# Patient Record
Sex: Female | Born: 2000 | Race: Black or African American | Hispanic: Yes | Marital: Single | Smoking: Current some day smoker
Health system: Southern US, Community
[De-identification: ages and names within clinical notes are randomized; demographics above are authoritative.]

## PROBLEM LIST (undated history)

## (undated) DIAGNOSIS — F419 Anxiety disorder, unspecified: Secondary | ICD-10-CM

---
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
## 2020-12-25 ENCOUNTER — Emergency Department: Payer: Medicaid Other

## 2020-12-25 ENCOUNTER — Emergency Department
Admission: EM | Admit: 2020-12-25 | Discharge: 2020-12-25 | Disposition: A | Discharge status: Home / Self Care | Payer: Medicaid Other | Attending: Emergency Medicine | Admitting: Emergency Medicine

## 2020-12-25 ENCOUNTER — Other Ambulatory Visit: Payer: Self-pay

## 2020-12-25 ENCOUNTER — Encounter: Payer: Self-pay | Admitting: Emergency Medicine

## 2020-12-25 DIAGNOSIS — U071 COVID-19: Secondary | ICD-10-CM | POA: Insufficient documentation

## 2020-12-25 DIAGNOSIS — B349 Viral infection, unspecified: Secondary | ICD-10-CM | POA: Insufficient documentation

## 2020-12-25 DIAGNOSIS — Z79899 Other long term (current) drug therapy: Secondary | ICD-10-CM | POA: Insufficient documentation

## 2020-12-25 DIAGNOSIS — R0602 Shortness of breath: Secondary | ICD-10-CM | POA: Diagnosis present

## 2020-12-25 DIAGNOSIS — Z20822 Contact with and (suspected) exposure to covid-19: Secondary | ICD-10-CM

## 2020-12-25 LAB — SARS CORONAVIRUS 2 (TAT 6-24 HRS): SARS Coronavirus 2: POSITIVE — AB

## 2020-12-25 MED ORDER — ALBUTEROL SULFATE HFA 108 (90 BASE) MCG/ACT IN AERS: 2.0000 | INHALATION_SPRAY | Freq: Four times a day (QID) | RESPIRATORY_TRACT | 0 refills | Status: DC | PRN

## 2020-12-25 NOTE — ED Triage Notes (Addendum)
Patient ambulatory to triage with steady gait, without difficulty or distress noted; pt reports body aches & congestion tonight

## 2020-12-25 NOTE — ED Provider Notes (Signed)
[facility] Emergency Department Provider Note   ____________________________________________   Event Date/Time   First MD Initiated Contact with Patient 12/25/20 [phone]     (approximate)  I have reviewed the triage vital signs and the nursing notes.   HISTORY  Chief Complaint Nasal Congestion    HPI Megan Saunders [place] is a 20 y.o. female with past medical history of anxiety who presents to the ED complaining of shortness of breath.  Patient reports that over the past 2 days she has developed cough, congestion, body aches, and difficulty breathing.  She is concerned she could have COVID-19 as someone at her work recently tested positive.  She has not had any fevers or chest pain, denies vomiting or diarrhea.  She has minimal difficulty breathing at rest but states her breathing gets worse when she is moving or coughing.  She has not taken anything for her symptoms at home.  She reports receiving 1 dose of the COVID-19 vaccine.        Past Medical History:  Diagnosis Date  . Anxiety     Patient Active Problem List   Diagnosis Date Noted  . MDD (major depressive disorder), recurrent episode, severe (HCC) 04/24/2018    History reviewed. No pertinent surgical history.  Prior to Admission medications   Medication Sig Start Date End Date Taking? Authorizing Provider  albuterol (VENTOLIN HFA) 108 (90 Base) MCG/ACT inhaler Inhale 2 puffs into the lungs every 6 (six) hours as needed for wheezing or shortness of breath. 12/25/20  Yes Chesley Noon, MD  escitalopram (LEXAPRO) 20 MG tablet Take 1 tablet (20 mg total) by mouth daily. 05/01/18   Denzil Magnuson, NP    Allergies Dust mite extract, Grass extracts [gramineae pollens], Mold extract [trichophyton], Other, and Pollen extract  No family history on file.  Social History Social History   Tobacco Use  . Smoking status: Never Smoker  . Smokeless tobacco: Never Used  Substance Use Topics  . Alcohol  use: Never  . Drug use: Never    Review of Systems  Constitutional: No fever/chills Eyes: No visual changes. ENT: No sore throat. Cardiovascular: Denies chest pain. Respiratory: Positive for cough and shortness of breath. Gastrointestinal: No abdominal pain.  No nausea, no vomiting.  No diarrhea.  No constipation. Genitourinary: Negative for dysuria. Musculoskeletal: Negative for back pain.  Positive for myalgias. Skin: Negative for rash. Neurological: Negative for headaches, focal weakness or numbness.  ____________________________________________   PHYSICAL EXAM:  VITAL SIGNS: ED Triage Vitals  Enc Vitals Group     BP 12/25/20 0321 121/73     Pulse Rate 12/25/20 0321 (!) 136     Resp 12/25/20 0321 18     Temp 12/25/20 0321 99.3 F (37.4 C)     Temp Source 12/25/20 0321 Oral     SpO2 12/25/20 0321 97 %     Weight 12/25/20 0316 270 lb (122.5 kg)     Height 12/25/20 0316 5\' 9"  (1.753 Saunders)     Head Circumference --      Peak Flow --      Pain Score 12/25/20 0316 0     Pain Loc --      Pain Edu? --      Excl. in GC? --     Constitutional: Alert and oriented. Eyes: Conjunctivae are normal. Head: Atraumatic. Nose: No congestion/rhinnorhea. Mouth/Throat: Mucous membranes are moist. Neck: Normal ROM Cardiovascular: Normal rate, regular rhythm. Grossly normal heart sounds.  2+ radial pulses bilaterally. Respiratory:  Normal respiratory effort.  No retractions. Lungs CTAB. Gastrointestinal: Soft and nontender. No distention. Genitourinary: deferred Musculoskeletal: No lower extremity tenderness nor edema. Neurologic:  Normal speech and language. No gross focal neurologic deficits are appreciated. Skin:  Skin is warm, dry and intact. No rash noted. Psychiatric: Mood and affect are normal. Speech and behavior are normal.  ____________________________________________   LABS (all labs ordered are listed, but only abnormal results are displayed)  Labs Reviewed  SARS  CORONAVIRUS 2 (TAT 6-24 HRS)   ____________________________________________  EKG  ED ECG REPORT I, Chesley Noon, the attending physician, personally viewed and interpreted this ECG.   Date: 12/25/2020  EKG Time: 4:36  Rate: 125  Rhythm: sinus tachycardia  Axis: Normal  Intervals:none  ST&T Change: None   PROCEDURES  Procedure(s) performed (including Critical Care):  Procedures   ____________________________________________   INITIAL IMPRESSION / ASSESSMENT AND PLAN / ED COURSE       20 year old female with past medical history of anxiety who presents to the ED complaining of cough, shortness of breath, congestion, and myalgias for the past 48 hours.  Patient initially noted to be tachycardic, but this is gradually improving without intervention.  EKG shows sinus tachycardia without ischemic changes.  We will check chest x-ray and testing for COVID-19, overall symptoms sound most consistent with viral syndrome and likely COVID-19.  Low suspicion for PE as patient denies any chest pain and has minimal difficulty breathing, is maintaining O2 sats on room air.  Chest x-ray reviewed by me and shows no infiltrate, edema, or effusion.  Patient continues to maintain O2 sats on room air and is appropriate for discharge home with PCP follow-up.  She was counseled to return to the ED for new worsening symptoms, patient agrees with plan.      ____________________________________________   FINAL CLINICAL IMPRESSION(S) / ED DIAGNOSES  Final diagnoses:  Viral syndrome  Suspected COVID-19 virus infection     ED Discharge Orders         Ordered    albuterol (VENTOLIN HFA) 108 (90 Base) MCG/ACT inhaler  Every 6 hours PRN       Note to Pharmacy: Please supply with spacer   12/25/20 0542           Note:  This document was prepared using Dragon voice recognition software and may include unintentional dictation errors.   Chesley Noon, MD 12/25/20 [phone]

## 2021-01-08 ENCOUNTER — Encounter: Payer: Self-pay | Admitting: Emergency Medicine

## 2021-11-26 IMAGING — CR DG CHEST 2V
2 series · 2 of 2 positions shown · non-contrast
Comparison: 09/03/2010

CLINICAL DATA: Dyspnea, chest congestion

EXAM:
CHEST - 2 VIEW

[chest pa]
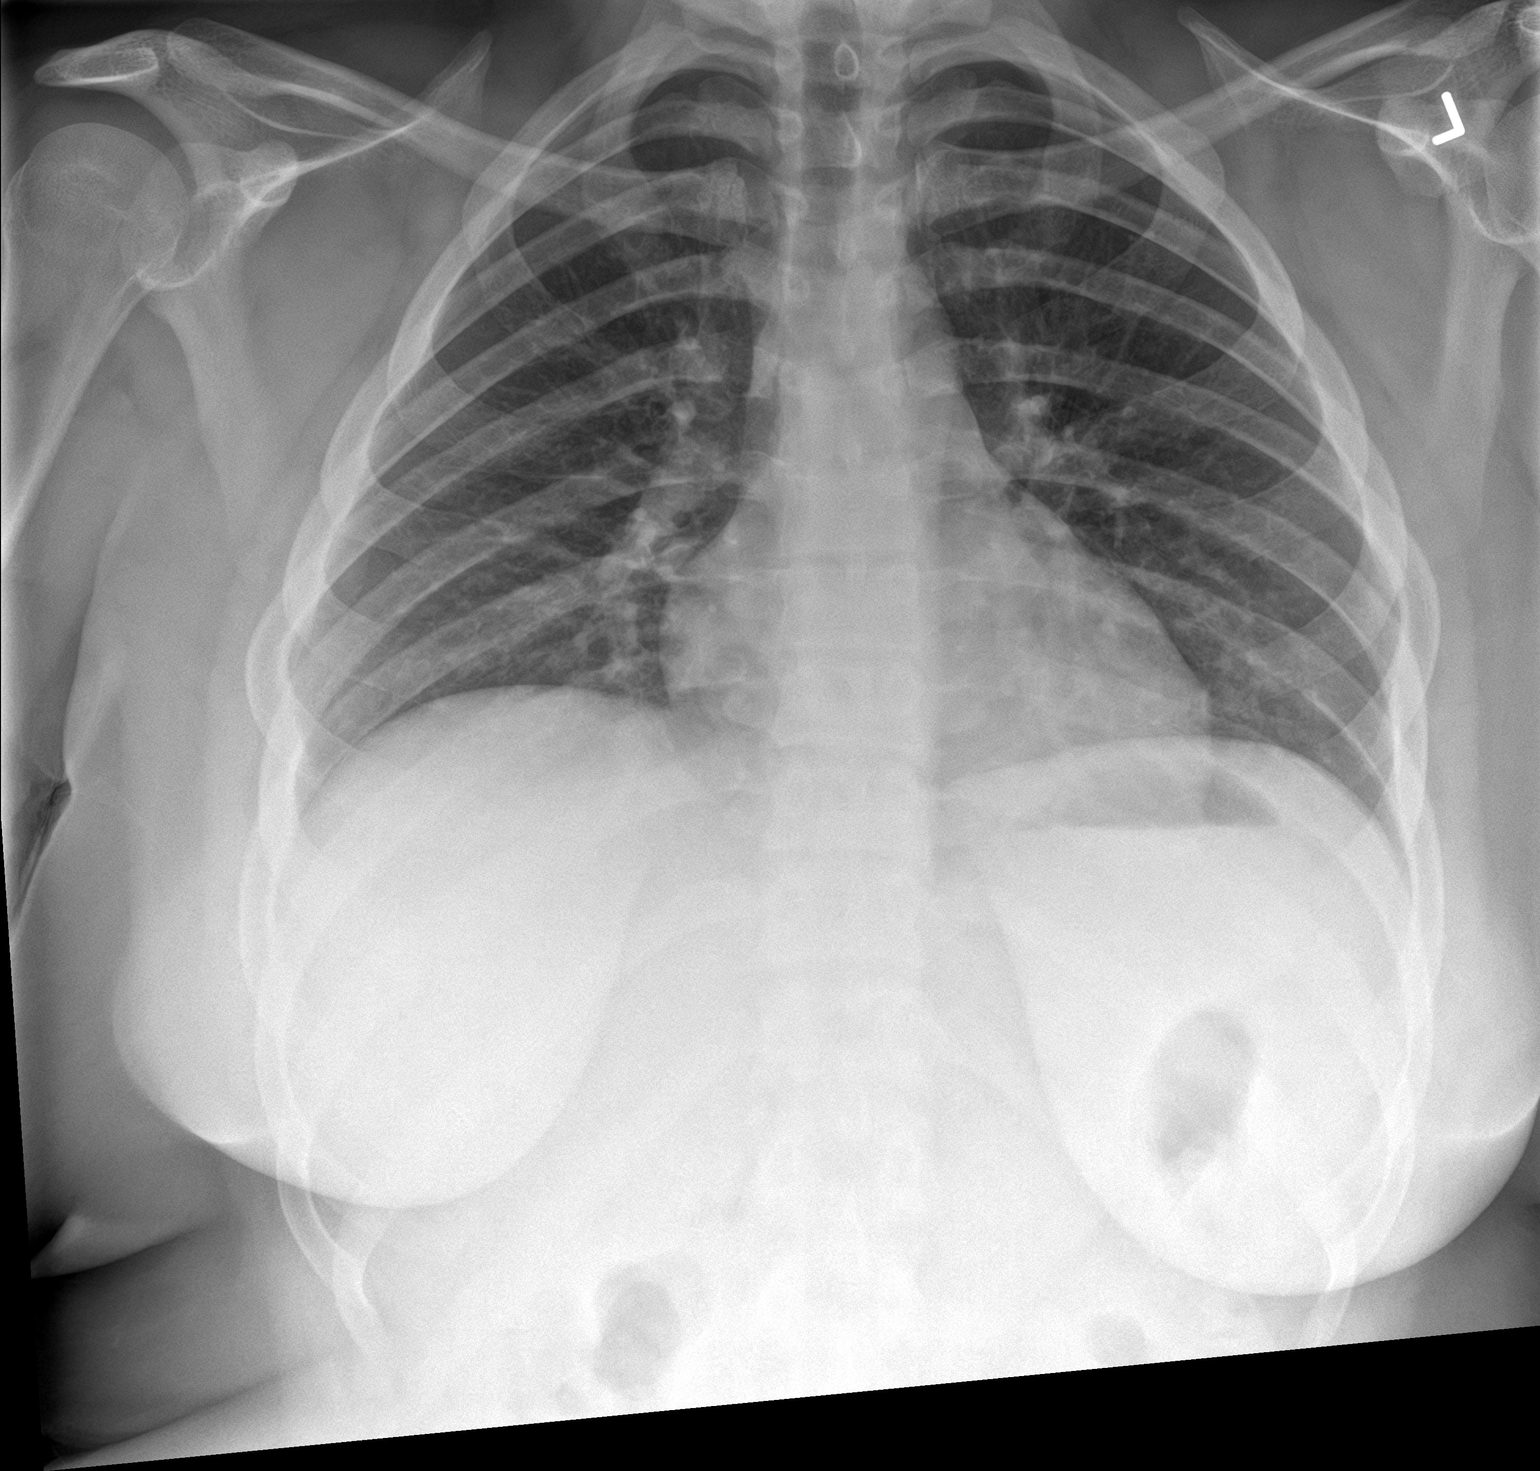

[chest lat]
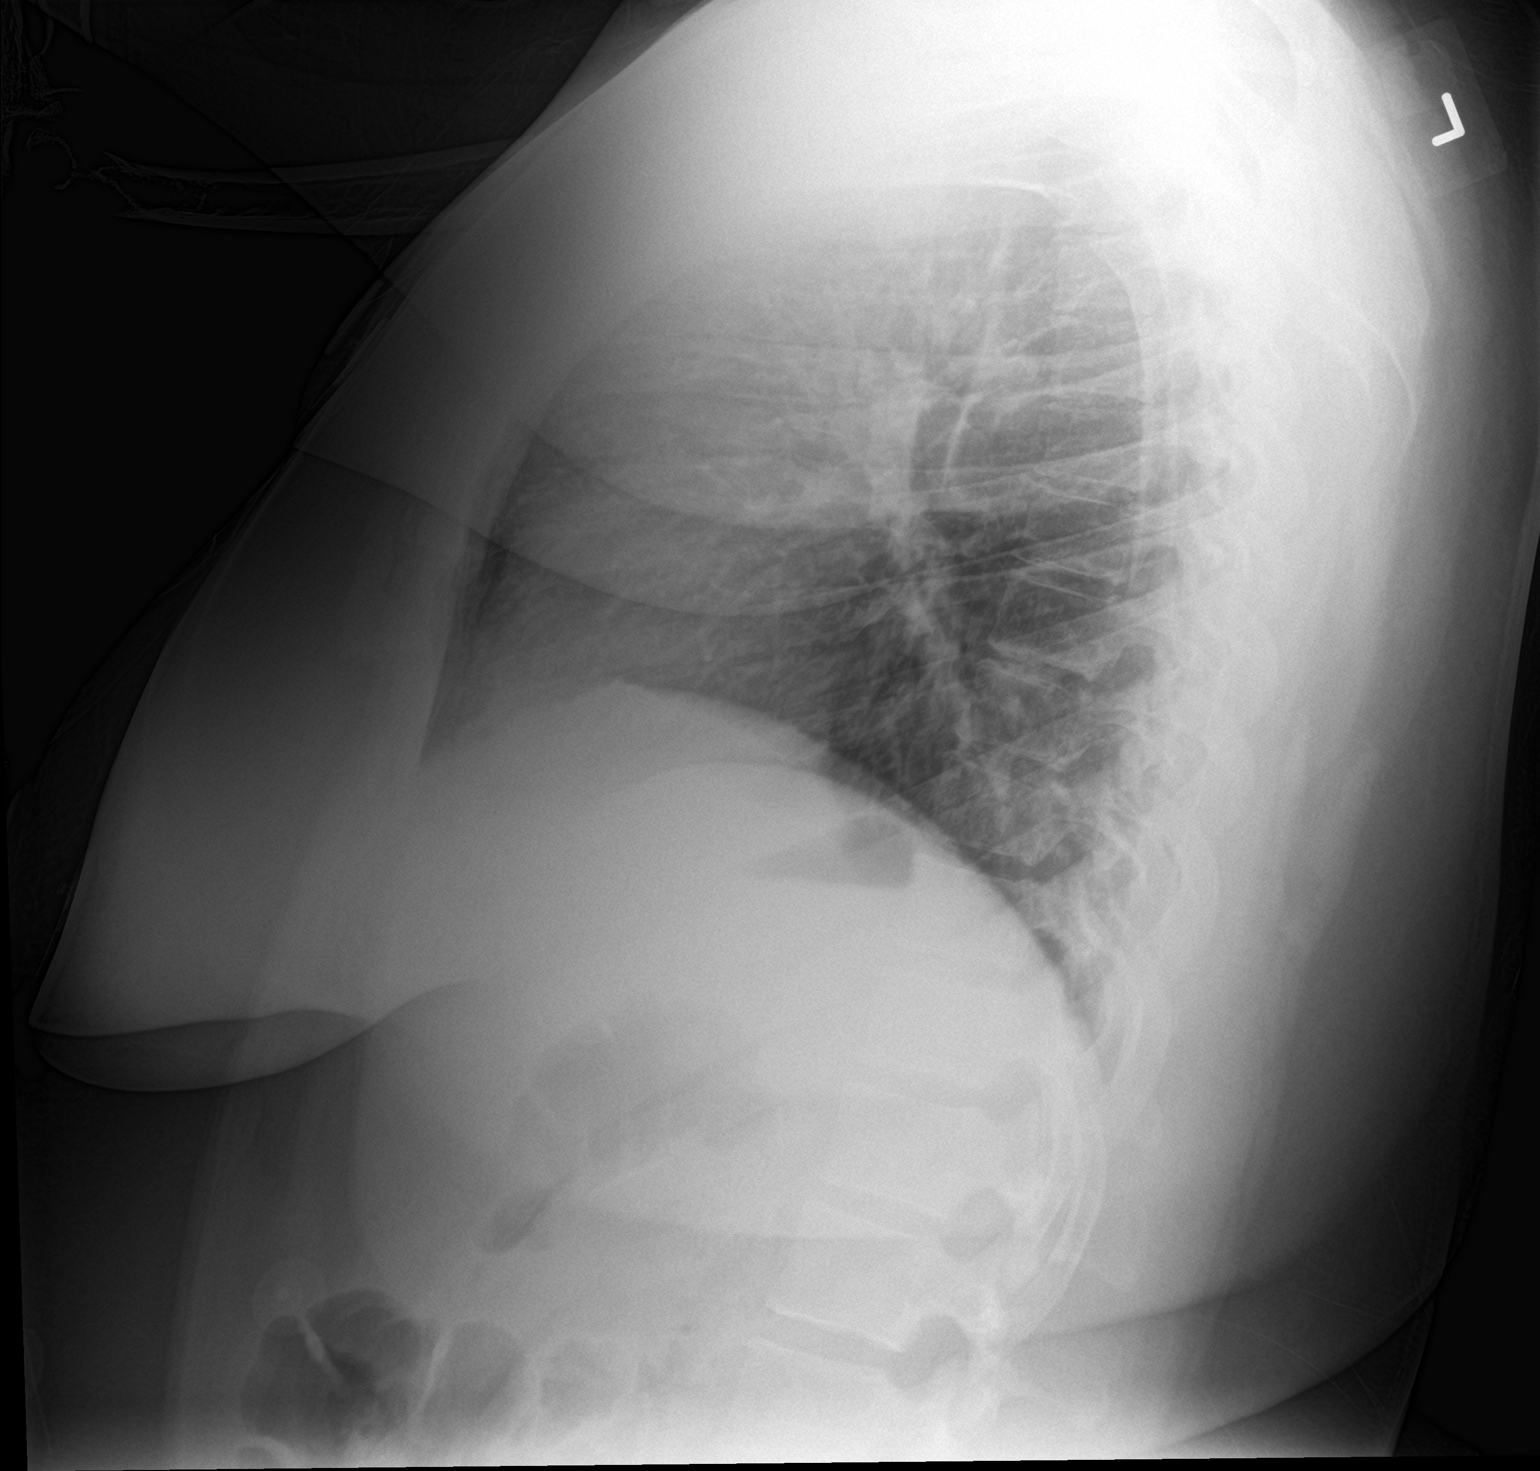

[2 of 2 positions shown; findings below may reference images not displayed]

FINDINGS: The heart size and mediastinal contours are within normal limits.
Both lungs are clear. The visualized skeletal structures are
unremarkable.
IMPRESSION: No active cardiopulmonary disease.

## 2023-01-09 ENCOUNTER — Ambulatory Visit: Payer: Medicaid Other | Admitting: Family Medicine

## 2023-01-09 ENCOUNTER — Encounter: Payer: Self-pay | Admitting: Family Medicine

## 2023-01-09 DIAGNOSIS — Z113 Encounter for screening for infections with a predominantly sexual mode of transmission: Secondary | ICD-10-CM

## 2023-01-09 LAB — WET PREP FOR TRICH, YEAST, CLUE
Trichomonas Exam: NEGATIVE
Yeast Exam: NEGATIVE

## 2023-01-09 NOTE — Progress Notes (Signed)
Pt is here for STD screening.  Wet mount results reviewed, no treatment required.  Condoms declined.  Windle Guard, RN

## 2023-01-09 NOTE — Progress Notes (Signed)
[facility] Department  STI clinic/screening visit [place] [address] [phone]  Subjective:  Megan Saunders is a 22 y.o. female being seen today for an STI screening visit. The patient reports they do not have symptoms.  Patient reports that they do not desire a pregnancy in the next year.   They reported they are not interested in discussing contraception today.    Patient's last menstrual period was 12/26/2022 (exact date).  Patient has the following medical conditions:   Patient Active Problem List   Diagnosis Date Noted   MDD (major depressive disorder), recurrent episode, severe ([place]) 04/24/2018    Chief Complaint  Patient presents with   SEXUALLY TRANSMITTED DISEASE    Screening for STI    HPI  Patient reports to clinic for STI testing. Asymptomatic  Does the patient using douching products? No  Last HIV test per patient/review of record was No results found for: "HMHIVSCREEN" No results found for: "HIV" Patient reports last pap was never- pt just turned 21 in Sept 23  Screening for MPX risk: Does the patient have an unexplained rash? No Is the patient MSM? No Does the patient endorse multiple sex partners or anonymous sex partners? No Did the patient have close or sexual contact with a person diagnosed with MPX? No Has the patient traveled outside the [name] where MPX is endemic? No Is there a high clinical suspicion for MPX-- evidenced by one of the following No  -Unlikely to be chickenpox  -Lymphadenopathy  -Rash that present in same phase of evolution on any given body part See flowsheet for further details and programmatic requirements.   Immunization history:  Immunization History  Administered Date(s) Administered   Tdap 05/03/2019     The following portions of the patient's history were reviewed and updated as appropriate: allergies, current medications, past medical history, past social history, past surgical  history and problem list.  Objective:  There were no vitals filed for this visit.  Physical Exam Vitals and nursing note reviewed.  Constitutional:      Appearance: She is obese.  HENT:     Head: Normocephalic and atraumatic.     Mouth/Throat:     Mouth: Mucous membranes are moist.     Pharynx: Oropharynx is clear. No oropharyngeal exudate or posterior oropharyngeal erythema.  Pulmonary:     Effort: Pulmonary effort is normal.  Abdominal:     Palpations: Abdomen is soft. There is no mass.     Tenderness: There is no abdominal tenderness. There is no rebound.  Genitourinary:    General: Normal vulva.     Exam position: Lithotomy position.     Pubic Area: No rash or pubic lice.      Labia:        Right: No rash or lesion.        Left: No rash or lesion.      Vagina: Normal. No vaginal discharge, erythema, bleeding or lesions.     Cervix: No cervical motion tenderness, discharge, friability, lesion or erythema.     Uterus: Normal.      Adnexa: Right adnexa normal and left adnexa normal.     Rectum: Normal.     Comments: pH = 4 Lymphadenopathy:     Head:     Right side of head: No preauricular or posterior auricular adenopathy.     Left side of head: No preauricular or posterior auricular adenopathy.     Cervical: No cervical adenopathy.  Upper Body:     Right upper body: No supraclavicular, axillary or epitrochlear adenopathy.     Left upper body: No supraclavicular, axillary or epitrochlear adenopathy.     Lower Body: No right inguinal adenopathy. No left inguinal adenopathy.  Skin:    General: Skin is warm and dry.     Findings: No rash.  Neurological:     Mental Status: She is alert and oriented to person, place, and time.      Assessment and Plan:  Megan Saunders is a 22 y.o. female presenting to the [facility] Department for STI screening  1. Screening for venereal disease  - HIV [facility] - Chlamydia/Gonorrhea [place] - Syphilis  Serology,  Lab - Gonococcus culture   Patient accepted all screenings including oral, vaginal CT/GC and bloodwork for HIV/RPR, and wet prep. Patient meets criteria for HepB screening? No. Ordered? not applicable Patient meets criteria for HepC screening? No. Ordered? not applicable  Treat wet prep per standing order Discussed time line for [facility] results and that patient will be called with positive results and encouraged patient to call if she had not heard in 2 weeks.  Counseled to return or seek care for continued or worsening symptoms Recommended condom use with all sex  Patient is currently using Hormonal Contraception: Injection, Rings and Patches to prevent pregnancy.    Return if symptoms worsen or fail to improve.  Total time spent 20 minutes.  Sharlet Salina, [place]

## 2023-01-09 NOTE — Addendum Note (Signed)
Addended by: Forest Becker on: 01/09/2023 02:52 PM   Modules accepted: Orders

## 2023-01-09 NOTE — Progress Notes (Signed)
Pt is here for STD screening.  Wet mount results reviewed, no treatment required.   Condoms declined.  .mecreed

## 2023-01-10 LAB — HM HIV SCREENING LAB: HM HIV Screening: NEGATIVE

## 2023-01-14 LAB — GONOCOCCUS CULTURE

## 2023-06-18 ENCOUNTER — Emergency Department
Admission: EM | Admit: 2023-06-18 | Discharge: 2023-06-18 | Disposition: A | Discharge status: Home / Self Care | Payer: Medicaid Other | Attending: Emergency Medicine | Admitting: Emergency Medicine

## 2023-06-18 ENCOUNTER — Other Ambulatory Visit: Payer: Self-pay

## 2023-06-18 DIAGNOSIS — S60221A Contusion of right hand, initial encounter: Secondary | ICD-10-CM | POA: Insufficient documentation

## 2023-06-18 DIAGNOSIS — S0993XA Unspecified injury of face, initial encounter: Secondary | ICD-10-CM | POA: Diagnosis present

## 2023-06-18 DIAGNOSIS — S0083XA Contusion of other part of head, initial encounter: Secondary | ICD-10-CM
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
## 2023-11-27 ENCOUNTER — Ambulatory Visit: Payer: Medicaid Other

## 2023-12-15 ENCOUNTER — Ambulatory Visit: Payer: Medicaid Other | Admitting: Nurse Practitioner

## 2023-12-15 ENCOUNTER — Encounter: Payer: Self-pay | Admitting: Family Medicine

## 2023-12-15 VITALS — BP 107/75 | HR 68 | Wt 270.6 lb

## 2023-12-15 DIAGNOSIS — Z113 Encounter for screening for infections with a predominantly sexual mode of transmission: Secondary | ICD-10-CM

## 2023-12-15 DIAGNOSIS — Z7251 High risk heterosexual behavior: Secondary | ICD-10-CM

## 2023-12-15 LAB — HM HIV SCREENING LAB: HM HIV Screening: NEGATIVE

## 2023-12-15 LAB — WET PREP FOR TRICH, YEAST, CLUE
Trichomonas Exam: NEGATIVE
Yeast Exam: NEGATIVE

## 2023-12-15 LAB — HM HEPATITIS C SCREENING LAB: HM Hepatitis Screen: NEGATIVE

## 2023-12-15 LAB — HEPATITIS B SURFACE ANTIGEN: Hepatitis B Surface Ag: NONREACTIVE

## 2023-12-15 MED ORDER — ELLA 30 MG PO TABS
1.0000 | ORAL_TABLET | Freq: Once | ORAL | 0 refills | Status: AC
Start: 2023-12-15 — End: 2023-12-15

## 2023-12-15 NOTE — Progress Notes (Cosign Needed)
[facility] [address] Main Number: [phone]   Family Planning Visit- Initial Visit  Subjective:  Megan Saunders is a 22 y.o.  No obstetric history on file.   being seen today for an initial annual visit and to discuss reproductive life planning.  The patient is currently using Oral Contraceptive for pregnancy prevention. Patient reports   does not want a pregnancy in the next year.    They report they are looking for a method that provides High efficacy at preventing pregnancy  Patient has the following medical conditions has MDD (major depressive disorder), recurrent episode, severe (HCC) on their problem list.  CC: Patient requests ECP for unprotected sex less than 12 hours ago.   Patient reports to clinic today for asymptomatic STI TOC testing for chlamydia. Please see separate encounter note for information specific to that visit.  The patient also mentioned having unprotected sex this morning and missing several hormonal oral contraceptive pills since having her last period. Patient requests ECP today.    There is no height or weight on file to calculate BMI. - Patient is eligible for diabetes screening based on BMI> 61 and age >35?  no HA1C ordered? not applicable  Patient reports 2  partner/s in last year. Desires STI screening?  Yes  Has patient been screened once for HCV in the past?  No  No results found for: "HCVAB"  Does the patient have current drug use (including MJ), have a partner with drug use, and/or has been incarcerated since last result? Yes  If yes-- Screen for HCV through [facility]   Does the patient meet criteria for HBV testing? Yes  Criteria:  -Household, sexual or needle sharing contact with HBV -History of drug use -HIV positive -Those with known Hep C   Health Maintenance Due  Topic Date Due   HPV VACCINES (1 - 3-dose series) Never done   CHLAMYDIA SCREENING  04/26/2019    Hepatitis C Screening  Never done   Cervical Cancer Screening (Pap smear)  Never done   INFLUENZA VACCINE  07/20/2023   COVID-19 Vaccine (1 - 2024-25 season) Never done    Review of Systems  Unable to perform ROS: Other (Patient arrived for STI screening appointment. Family Planning appt created solely for prescription of ECP. No phytsical performed.)    The following portions of the patient's history were reviewed and updated as appropriate: allergies, current medications, past family history, past medical history, past social history, past surgical history and problem list. Problem list updated.   See flowsheet for other program required questions.  Objective:  There were no vitals filed for this visit.  Physical Exam Nursing note reviewed.  Constitutional:      Appearance: Normal appearance.  HENT:     Head: Normocephalic.     Salivary Glands: Right salivary gland is not diffusely enlarged or tender. Left salivary gland is not diffusely enlarged or tender.     Mouth/Throat:     Lips: Pink. No lesions.     Mouth: Mucous membranes are moist.     Tongue: No lesions. Tongue does not deviate from midline.     Pharynx: Oropharynx is clear. Uvula midline. No oropharyngeal exudate or posterior oropharyngeal erythema.     Tonsils: No tonsillar exudate.  Eyes:     General:        Right eye: No discharge.        Left eye: No discharge.  Pulmonary:  Effort: Pulmonary effort is normal.  Genitourinary:    Comments: Patient asymptomatic. Declined genital exam. Self swabbed.  Lymphadenopathy:     Head:     Right side of head: No submental, submandibular, tonsillar, preauricular or posterior auricular adenopathy.     Left side of head: No submental, submandibular, tonsillar, preauricular or posterior auricular adenopathy.     Cervical: No cervical adenopathy.     Right cervical: No superficial or posterior cervical adenopathy.    Left cervical: No superficial or posterior cervical  adenopathy.     Upper Body:     Right upper body: No supraclavicular or axillary adenopathy.     Left upper body: No supraclavicular or axillary adenopathy.  Skin:    General: Skin is warm and dry.     Comments: Exposed areas only and back. Skin tone appropriate for ethnicity.   Neurological:     Mental Status: Megan Saunders is alert and oriented to person, place, and time.  Psychiatric:        Attention and Perception: Attention normal.        Mood and Affect: Mood is anxious. Affect is flat.        Speech: Speech normal.        Behavior: Behavior normal. Behavior is cooperative.        Thought Content: Thought content normal.     Comments: Patient did not make eye contact.     Assessment and Plan:  Megan Saunders is a 22 y.o. female presenting to the [facility] Department for an initial annual wellness/contraceptive visit  1. Unprotected sex (Primary) Contraception counseling: Reviewed options based on patient desire and reproductive life plan. Patient is interested in Oral Contraceptive. This was provided to the patient today.   Risks, benefits, and typical effectiveness rates were reviewed.  Questions were answered.  Written information was also given to the patient to review.    The patient will follow up in  3 months for surveillance.  The patient was told to call with any further questions, or with any concerns about this method of contraception.  Emphasized use of condoms 100% of the time for STI prevention.  Educated on ECP and assessed for need of ECP. Patient reported Unprotected sex within past 72 hours.  Reviewed options and patient desired Ella (Ulipristal)   - ulipristal acetate (ELLA) 30 MG tablet; Take 1 tablet (30 mg total) by mouth once for 1 dose.  Dispense: 1 tablet; Refill: 0  Return in about 3 months (around 03/14/2024) for STI screening.  Total time with patient 30 minutes.   No future appointments.  Edmonia James, NP

## 2023-12-15 NOTE — Progress Notes (Cosign Needed)
[facility] Department  STI clinic/screening visit [address] [place] [address] [phone]  Subjective:  Megan Saunders [place] is a 22 y.o. female being seen today for an STI screening visit. The patient reports they do not have symptoms.  Patient reports that they do not desire a pregnancy in the next year.   They reported they are not interested in discussing contraception today.    Patient's last menstrual period was 12/04/2023.  Patient has the following medical conditions:   Patient Active Problem List   Diagnosis Date Noted   MDD (major depressive disorder), recurrent episode, severe (HCC) 04/24/2018    Chief Complaint  Patient presents with   SEXUALLY TRANSMITTED DISEASE    Pt here for STD screening and have no symptoms    Patient is a female who presents today for asymptomatic TOC. She tested positive for chlamydia on 11/25/23 and completed oral antibiotics on 12/01/23. She states her partner also received treatment and they had sex with a condom about 2 days after he completed his treatment. She reports female and female partners and reports vaginal and oral sex with condom use sometimes.   Patient reports OCP use and misses pills frequently. Last missed pill 12/14/23 (1 day prior).   Last sexual encounter today without a condom with a female partner.   Patient has never had a PAP. Encouraged patient to schedule an appointment for PAP testing soon.    Does the patient using douching products? Not assessed.   Last HIV test per patient/review of record was  Lab Results  Component Value Date   HMHIVSCREEN Negative - Validated 01/10/2023    Last HEPC test per patient/review of record was No results found for: "HMHEPCSCREEN" No components found for: "HEPC"   Last HEPB test per patient/review of record was No components found for: "HMHEPBSCREEN"   Screening for MPX risk: Does the patient have an unexplained rash? No Is the patient MSM? No Does the  patient endorse multiple sex partners or anonymous sex partners? Yes Did the patient have close or sexual contact with a person diagnosed with MPX? No Has the patient traveled outside the [name] where MPX is endemic? No Is there a high clinical suspicion for MPX-- evidenced by one of the following No  -Unlikely to be chickenpox  -Lymphadenopathy  -Rash that present in same phase of evolution on any given body part See flowsheet for further details and programmatic requirements.   Immunization history:  Immunization History  Administered Date(s) Administered   Tdap 05/03/2019     The following portions of the patient's history were reviewed and updated as appropriate: allergies, current medications, past medical history, past social history, past surgical history and problem list.  Objective:  There were no vitals filed for this visit.  Physical Exam Nursing note reviewed.  Constitutional:      Appearance: Normal appearance.  HENT:     Head: Normocephalic.     Salivary Glands: Right salivary gland is not diffusely enlarged or tender. Left salivary gland is not diffusely enlarged or tender.     Mouth/Throat:     Lips: Pink. No lesions.     Mouth: Mucous membranes are moist.     Tongue: No lesions. Tongue does not deviate from midline.     Pharynx: Oropharynx is clear. Uvula midline. No oropharyngeal exudate or posterior oropharyngeal erythema.     Tonsils: No tonsillar exudate.  Eyes:     General:  Right eye: No discharge.        Left eye: No discharge.  Pulmonary:     Effort: Pulmonary effort is normal.  Genitourinary:    Comments: Patient asymptomatic. Declined genital exam. Self swabbed.  Lymphadenopathy:     Head:     Right side of head: No submental, submandibular, tonsillar, preauricular or posterior auricular adenopathy.     Left side of head: No submental, submandibular, tonsillar, preauricular or posterior auricular adenopathy.     Cervical: No cervical  adenopathy.     Right cervical: No superficial or posterior cervical adenopathy.    Left cervical: No superficial or posterior cervical adenopathy.     Upper Body:     Right upper body: No supraclavicular or axillary adenopathy.     Left upper body: No supraclavicular or axillary adenopathy.  Skin:    General: Skin is warm and dry.     Comments: Exposed areas only and back. Skin tone appropriate for ethnicity.   Neurological:     Mental Status: She is alert and oriented to person, place, and time.  Psychiatric:        Attention and Perception: Attention normal.        Mood and Affect: Mood is anxious. Affect is flat.        Speech: Speech normal.        Behavior: Behavior normal. Behavior is cooperative.        Thought Content: Thought content normal.     Comments: Patient did not make eye contact.     Assessment and Plan:  Megan Saunders [place] is a 22 y.o. female presenting to the [facility] Department for STI screening  1. Screening for venereal disease (Primary) Explained to patient that per CDC guidelines she should be retested in 3 months following completion of treatment. She verbalized understanding and agreed to come back for future testing; however, repeat testing was also done today as follows:  - HIV/HCV [facility] - HBV Antigen/Antibody [facility] - Chlamydia/Gonorrhea [facility] - Syphilis Serology, [place] [facility] - WET PREP FOR TRICH, YEAST, CLUE - Gonococcus culture  Patient accepted all screenings including oral, vaginal CT/GC and bloodwork for HIV/RPR, and wet prep. Patient meets criteria for HepB screening? Yes. Ordered? yes Patient meets criteria for HepC screening? Yes. Ordered? yes  Treat wet prep per standing order Discussed time line for [facility] results and that patient will be called with positive results and encouraged patient to call if she had not heard in 2 weeks.  Counseled to return or seek care for continued or worsening  symptoms Recommended repeat testing in 3 months with positive results. Recommended condom use with all sex  Patient is currently using  OCP  to prevent pregnancy.    Return in about 3 months (around 03/14/2024) for STI screening.  No future appointments.  Total time with patient 30 minutes.   Edmonia James, NP

## 2023-12-15 NOTE — Progress Notes (Signed)
Pt is here for STD screening , Wet prep results reviewed with pt, no treatment required per standing order. Condoms Given. The patient was dispensed ECP today. I provided counseling today regarding the medication. We discussed the medication, the side effects and when to call clinic. Patient given the opportunity to ask questions for clarifications. Sonda Primes, RN.

## 2023-12-20 LAB — GONOCOCCUS CULTURE

## 2024-02-15 DIAGNOSIS — F603 Borderline personality disorder: Secondary | ICD-10-CM | POA: Diagnosis not present

## 2024-02-22 DIAGNOSIS — F603 Borderline personality disorder: Secondary | ICD-10-CM | POA: Diagnosis not present

## 2024-03-05 DIAGNOSIS — Z113 Encounter for screening for infections with a predominantly sexual mode of transmission: Secondary | ICD-10-CM | POA: Diagnosis not present

## 2024-03-07 DIAGNOSIS — F603 Borderline personality disorder: Secondary | ICD-10-CM | POA: Diagnosis not present

## 2024-03-10 DIAGNOSIS — F1290 Cannabis use, unspecified, uncomplicated: Secondary | ICD-10-CM | POA: Diagnosis not present

## 2024-03-10 DIAGNOSIS — F332 Major depressive disorder, recurrent severe without psychotic features: Secondary | ICD-10-CM | POA: Diagnosis not present

## 2024-03-10 DIAGNOSIS — F909 Attention-deficit hyperactivity disorder, unspecified type: Secondary | ICD-10-CM | POA: Diagnosis not present

## 2024-03-10 DIAGNOSIS — F4312 Post-traumatic stress disorder, chronic: Secondary | ICD-10-CM | POA: Diagnosis not present

## 2024-03-10 DIAGNOSIS — F411 Generalized anxiety disorder: Secondary | ICD-10-CM | POA: Diagnosis not present

## 2024-03-15 DIAGNOSIS — F1090 Alcohol use, unspecified, uncomplicated: Secondary | ICD-10-CM | POA: Diagnosis not present

## 2024-03-15 DIAGNOSIS — F603 Borderline personality disorder: Secondary | ICD-10-CM | POA: Diagnosis not present

## 2024-03-15 DIAGNOSIS — F332 Major depressive disorder, recurrent severe without psychotic features: Secondary | ICD-10-CM | POA: Diagnosis not present

## 2024-03-21 DIAGNOSIS — F411 Generalized anxiety disorder: Secondary | ICD-10-CM | POA: Diagnosis not present

## 2024-03-21 DIAGNOSIS — F332 Major depressive disorder, recurrent severe without psychotic features: Secondary | ICD-10-CM | POA: Diagnosis not present

## 2024-03-21 DIAGNOSIS — F603 Borderline personality disorder: Secondary | ICD-10-CM | POA: Diagnosis not present

## 2024-03-21 DIAGNOSIS — Z7251 High risk heterosexual behavior: Secondary | ICD-10-CM | POA: Diagnosis not present

## 2024-03-27 DIAGNOSIS — F603 Borderline personality disorder: Secondary | ICD-10-CM | POA: Diagnosis not present

## 2024-03-28 DIAGNOSIS — F603 Borderline personality disorder: Secondary | ICD-10-CM | POA: Diagnosis not present

## 2024-04-02 DIAGNOSIS — F603 Borderline personality disorder: Secondary | ICD-10-CM | POA: Diagnosis not present

## 2024-04-09 DIAGNOSIS — F3162 Bipolar disorder, current episode mixed, moderate: Secondary | ICD-10-CM | POA: Diagnosis not present

## 2024-04-09 DIAGNOSIS — F411 Generalized anxiety disorder: Secondary | ICD-10-CM | POA: Diagnosis not present

## 2024-04-09 DIAGNOSIS — F4310 Post-traumatic stress disorder, unspecified: Secondary | ICD-10-CM | POA: Diagnosis not present

## 2024-04-18 DIAGNOSIS — F603 Borderline personality disorder: Secondary | ICD-10-CM | POA: Diagnosis not present

## 2024-04-25 DIAGNOSIS — F603 Borderline personality disorder: Secondary | ICD-10-CM | POA: Diagnosis not present

## 2024-04-26 ENCOUNTER — Emergency Department
Admission: EM | Admit: 2024-04-26 | Discharge: 2024-04-28 | Disposition: A | Discharge status: Other Acute Inpatient Hospital | Attending: Emergency Medicine | Admitting: Emergency Medicine

## 2024-04-26 ENCOUNTER — Emergency Department

## 2024-04-26 DIAGNOSIS — X838XXA Intentional self-harm by other specified means, initial encounter: Secondary | ICD-10-CM | POA: Diagnosis not present

## 2024-04-26 DIAGNOSIS — R443 Hallucinations, unspecified: Secondary | ICD-10-CM

## 2024-04-26 DIAGNOSIS — R404 Transient alteration of awareness: Secondary | ICD-10-CM | POA: Diagnosis not present

## 2024-04-26 DIAGNOSIS — R45851 Suicidal ideations: Secondary | ICD-10-CM | POA: Diagnosis not present

## 2024-04-26 DIAGNOSIS — R4182 Altered mental status, unspecified: Secondary | ICD-10-CM | POA: Diagnosis not present

## 2024-04-26 DIAGNOSIS — T43592A Poisoning by other antipsychotics and neuroleptics, intentional self-harm, initial encounter: Secondary | ICD-10-CM | POA: Insufficient documentation

## 2024-04-26 DIAGNOSIS — R440 Auditory hallucinations: Secondary | ICD-10-CM | POA: Diagnosis not present

## 2024-04-26 DIAGNOSIS — T1491XA Suicide attempt, initial encounter: Secondary | ICD-10-CM | POA: Diagnosis not present

## 2024-04-26 DIAGNOSIS — I959 Hypotension, unspecified: Secondary | ICD-10-CM | POA: Diagnosis not present

## 2024-04-26 DIAGNOSIS — F17290 Nicotine dependence, other tobacco product, uncomplicated: Secondary | ICD-10-CM | POA: Insufficient documentation

## 2024-04-26 DIAGNOSIS — R000 Tachycardia, unspecified: Secondary | ICD-10-CM | POA: Diagnosis not present

## 2024-04-26 DIAGNOSIS — T50902A Poisoning by unspecified drugs, medicaments and biological substances, intentional self-harm, initial encounter: Secondary | ICD-10-CM | POA: Diagnosis not present

## 2024-04-26 DIAGNOSIS — R55 Syncope and collapse: Secondary | ICD-10-CM | POA: Diagnosis not present

## 2024-04-26 LAB — URINALYSIS, ROUTINE W REFLEX MICROSCOPIC
Bacteria, UA: NONE SEEN
Bilirubin Urine: NEGATIVE
Glucose, UA: NEGATIVE mg/dL
Ketones, ur: NEGATIVE mg/dL
Leukocytes,Ua: NEGATIVE
Nitrite: NEGATIVE
Protein, ur: 30 mg/dL — AB
Specific Gravity, Urine: 1.02 (ref 1.005–1.030)
pH: 5 (ref 5.0–8.0)

## 2024-04-26 LAB — SALICYLATE LEVEL
Salicylate Lvl: <7 mg/dL — ABNORMAL LOW (ref 7.0–30.0)
Salicylate Lvl: <7 mg/dL — ABNORMAL LOW (ref 7.0–30.0)

## 2024-04-26 LAB — COMPREHENSIVE METABOLIC PANEL WITH GFR
ALT: 14 U/L (ref 0–44)
AST: 13 U/L — ABNORMAL LOW (ref 15–41)
Albumin: 4 g/dL (ref 3.5–5.0)
Alkaline Phosphatase: 56 U/L (ref 38–126)
Anion gap: 9 (ref 5–15)
BUN: 8 mg/dL (ref 6–20)
CO2: 20 mmol/L — ABNORMAL LOW (ref 22–32)
Calcium: 9.2 mg/dL (ref 8.9–10.3)
Chloride: 110 mmol/L (ref 98–111)
Creatinine, Ser: 0.66 mg/dL (ref 0.44–1.00)
GFR, Estimated: >60 mL/min (ref >60)
Glucose, Bld: 109 mg/dL — ABNORMAL HIGH (ref 70–99)
Potassium: 4.1 mmol/L (ref 3.5–5.1)
Sodium: 139 mmol/L (ref 135–145)
Total Bilirubin: 0.5 mg/dL (ref 0.0–1.2)
Total Protein: 7.2 g/dL (ref 6.5–8.1)

## 2024-04-26 LAB — ETHANOL: Alcohol, Ethyl (B): <15 mg/dL (ref <15)

## 2024-04-26 LAB — URINE DRUG SCREEN, QUALITATIVE (ARMC ONLY)
Amphetamines, Ur Screen: NOT DETECTED
Barbiturates, Ur Screen: NOT DETECTED
Benzodiazepine, Ur Scrn: NOT DETECTED
Cannabinoid 50 Ng, Ur ~~LOC~~: POSITIVE — AB
Cocaine Metabolite,Ur ~~LOC~~: NOT DETECTED
MDMA (Ecstasy)Ur Screen: NOT DETECTED
Methadone Scn, Ur: NOT DETECTED
Opiate, Ur Screen: NOT DETECTED
Phencyclidine (PCP) Ur S: NOT DETECTED
Tricyclic, Ur Screen: POSITIVE — AB

## 2024-04-26 LAB — CBC WITH DIFFERENTIAL/PLATELET
Abs Immature Granulocytes: 0.04 10*3/uL (ref 0.00–0.07)
Basophils Absolute: 0.1 10*3/uL (ref 0.0–0.1)
Basophils Relative: 1 %
Eosinophils Absolute: 0.4 10*3/uL (ref 0.0–0.5)
Eosinophils Relative: 4 %
HCT: 39.2 % (ref 36.0–46.0)
Hemoglobin: 12.7 g/dL (ref 12.0–15.0)
Immature Granulocytes: 0 %
Lymphocytes Relative: 33 %
Lymphs Abs: 3.5 10*3/uL (ref 0.7–4.0)
MCH: 28.2 pg (ref 26.0–34.0)
MCHC: 32.4 g/dL (ref 30.0–36.0)
MCV: 87.1 fL (ref 80.0–100.0)
Monocytes Absolute: 0.6 10*3/uL (ref 0.1–1.0)
Monocytes Relative: 6 %
Neutro Abs: 6.2 10*3/uL (ref 1.7–7.7)
Neutrophils Relative %: 56 %
Platelets: 310 10*3/uL (ref 150–400)
RBC: 4.5 MIL/uL (ref 3.87–5.11)
RDW: 13.7 % (ref 11.5–15.5)
WBC: 10.8 10*3/uL — ABNORMAL HIGH (ref 4.0–10.5)
nRBC: 0 % (ref 0.0–0.2)

## 2024-04-26 LAB — POC URINE PREG, ED: Preg Test, Ur: NEGATIVE — AB

## 2024-04-26 LAB — ACETAMINOPHEN LEVEL
Acetaminophen (Tylenol), Serum: <10 ug/mL — ABNORMAL LOW (ref 10–30)
Acetaminophen (Tylenol), Serum: <10 ug/mL — ABNORMAL LOW (ref 10–30)

## 2024-04-26 NOTE — ED Notes (Signed)
 Edwina Gram, RN from and recommended repeat tylenol /salicylate in 4 hours from initial draw and EKG 6 hours after initial.

## 2024-04-26 NOTE — ED Notes (Addendum)
 2mg  narcan given per verbal order from Hendrick Locke MD. 1L NS started

## 2024-04-26 NOTE — ED Provider Notes (Incomplete)
 ----------------------------------------- 11:43 PM on 04/26/2024 -----------------------------------------  Assuming care from Dr. Hendrick Locke.  In short, Megan Saunders [place] is a 23 y.o. female with a chief complaint of intentional overdose.  Refer to the original H&P for additional details.  The current plan of care is to medically clear her and obtain psych consult.  Poison Control initially recommended 24-hour observation due to not knowing exactly what the patient took, but she has improved dramatically and may be appropriate for early clearance and psychiatric assessment   Clinical Course as of 04/27/24 0718  Sat Apr 27, 2024  0109 I received notification that the patient has been more agitated and had gotten up out of bed and was picking objects up off the floor.  She has a Comptroller in the room.  When I went and she had her head back and has sonorous respirations.  However she is immediately and aggressively responsive to painful stimuli including sternal sternal rub and painful stimuli to her upper extremities.  She will not verbalize anything, just makes inarticulate sounds.  She has constricted pupils bilaterally but will not open her eyes spontaneously, at least not consistently.  At 1 point when I tried to get her to open her eyes and talk to me she started to cry and seem to mumble "leave me alone" and then sob to few more times and then started snoring again.  However, she is protecting her airway.  Her GCS is 9 (eye + 2, verbal +2, motor +5).  If she decompensates I will consider intubation but at this point I do not think that is indicated.  However, I am also not comfortable giving her any sedating medications and we will continue to try to redirect her if/when she acts out.  I repeated an EKG which is listed below showing no significant changes in her intervals. [CF]  0201 Patient's behavior remains bizarre.  She has been increasingly agitated but with periods of calm in between.  At 1 point she  acted like she was going to hit one of the techs that is looking after her.  Now she is sitting on the floor with her eyes open and staring at the computer monitor as if it is a television.  She continues to make incomprehensible sounds but seems to respond at least to some degree to the stimuli in the room.  Some aspects of her behavior seem like an anticholinergic toxidrome, given that she is confused with bizarre behavior, but she does not have tachycardia, is not red or flushed, has constricted rather than dilated pupils, and does not seem particularly dry.  I think this is more or less a nonspecific result of the medication she took.  Under the circumstances she is representing a danger to herself.  She has been moved to a mattress on the floor but is still acting unpredictably and is at risk of harming herself or others.  I ordered Versed 2 mg IV as a calming agent. [CF]  [phone] Patient is escalating her behavior again but seems very confused and lacking capacity.  She seems unable to focus on anything in particular in the room but is combative and representing a danger to herself and others.  I considered restraints at least temporarily but I am a little bit reluctant to do that given the altered mental status and the questionable overdose.  However she needs something to calm her.  I feel that I cannot give her antipsychotics because of the prolonged QTc interval which may  be her baseline or may be secondary to overdose, but her QTc most recently was about 488 ms as previously documented.  I considered tricyclic overdose, but as with other medications including stimulants, benzodiazepines are likely the first-line, so I will order Ativan 4 mg IV; she had essentially no response to Versed 2 mg IV).  Will monitor closely.  Given her altered mental status, if she were to decompensate and need airway protection I will intubate her, but at this time she is protecting her airway even though she is not in full  control of her faculties, and I am reluctant to intubate her without additional indication that she requires such a significant step forward in her management. [CF]  0256 For her safety and the safety of the ED staff, I am ordering nonviolent and safety cuffs until she calms down (hopefully the Ativan will take effect). [CF]  [id number] I reassessed the patient.  She is sleeping comfortably and in no distress.  Soft restraints are still in place but she has good peripheral perfusion and is not in any distress.  For her safety we will maintain them at this time but she is doing well currently after the Ativan. [CF]  0645 Reassessed patient.  She is still sleeping comfortably and in no distress.  Maintaining safety cuffs at this time. [CF]  [id number] Transferring ED care to Dr. Demetrios Finders to follow-up and reassess. [CF]    Clinical Course User Index [CF] Lynnda Sas, MD   ED ECG REPORT I, Lynnda Sas, the attending physician, personally viewed and interpreted this ECG.  Date: 04/27/2024 EKG Time: 1:07 AM Rate: 98 Rhythm: normal sinus rhythm QRS Axis: normal Intervals: QTc 481 ms ST/T Wave abnormalities: Non-specific ST segment / T-wave changes, but no clear evidence of acute ischemia. Narrative Interpretation: no definitive evidence of acute ischemia; does not meet STEMI criteria.  Aaron Aas1-3 Lead EKG Interpretation  Performed by: Lynnda Sas, MD Authorized by: Lynnda Sas, MD     Interpretation: normal     ECG rate:  90   ECG rate assessment: normal     Rhythm: sinus rhythm     Ectopy: none     Conduction: normal   .Critical Care  Performed by: Lynnda Sas, MD Authorized by: Lynnda Sas, MD   Critical care provider statement:    Critical care time (minutes):  45   Critical care time was exclusive of:  Separately billable procedures and treating other patients   Critical care was necessary to treat or prevent imminent or life-threatening deterioration of the following conditions:   Toxidrome   Critical care was time spent personally by me on the following activities:  Development of treatment plan with patient or surrogate, evaluation of patient's response to treatment, examination of patient, obtaining history from patient or surrogate, ordering and performing treatments and interventions, ordering and review of laboratory studies, ordering and review of radiographic studies, pulse oximetry, re-evaluation of patient's condition and review of old charts    Medications  midazolam (VERSED) injection 2 mg (2 mg Intravenous Given 04/27/24 0212)  LORazepam (ATIVAN) injection 4 mg (4 mg Intravenous Given 04/27/24 0253)     ED Discharge Orders     None      Final diagnoses:  Intentional overdose, initial encounter [facility])  Suicide attempt (HCC)  Altered mental status, unspecified altered mental status type     Lynnda Sas, MD 04/27/24 1610    Lynnda Sas, MD 04/27/24 [phone]

## 2024-04-26 NOTE — ED Triage Notes (Addendum)
 Pt presenting to ED via EMS after possible drug overdose. Pt was picked up by mom at some location and became unresponsive while in the car with mom. Per EMS report pt possibly took seroquel. Pinpoint pupils and unresponsive on arrival to ED. 2mg  narcan given en route with EMS  Per EMS report, pt had been mentioning suicide this AM to her mom.

## 2024-04-26 NOTE — ED Notes (Signed)
 After assisting pt to the in-room commode, pt was oriented to time, place, and reason the plan while in the hospital. Pt got upset and was tearful. Pt was  ensured of her safety and was given  ginger ale, [place] sandwich, chips, and apple sauce for nutrition. Sitter is at the bedside as well.

## 2024-04-26 NOTE — ED Notes (Signed)
 Pt beginning to move around, eyes still closed and not speaking. 1mg  narcan given per verbal order from Hendrick Locke MD

## 2024-04-26 NOTE — ED Provider Notes (Signed)
 [facility] Provider Note    Event Date/Time   First MD Initiated Contact with Patient 04/26/24 1757     (approximate)   History   Drug Overdose   HPI  Megan Saunders is a 23 y.o. female who presents to the emergency department today via EMS for unresponsiveness.  Apparently the patient was in her car with family member when she became unresponsive.  Patient is unable to give any history.  Per reports she does have history of depression and self cutting.  She apparently had talked to somebody about maybe taking something.  Family on scene stated that she might have taken Seroquel although unclear how much.     Physical Exam   Triage Vital Signs: ED Triage Vitals [04/26/24 1750]  Encounter Vitals Group     BP (!) 90/53     Systolic BP Percentile      Diastolic BP Percentile      Pulse Rate (!) 129     Resp (!) 25     Temp 98.1 F (36.7 C)     Temp Source Oral     SpO2 100 %     Weight      Height      Head Circumference      Peak Flow      Pain Score      Pain Loc      Pain Education      Exclude from Growth Chart     Most recent vital signs: Vitals:   04/26/24 1750  BP: (!) 90/53  Pulse: (!) 129  Resp: (!) 25  Temp: 98.1 F (36.7 C)  SpO2: 100%   General: Unresponsive. CV:  Good peripheral perfusion. Tachycardia. Resp:  Normal effort. Lungs clear. Abd:  No distention.  Other:  Pinpoint pupils   ED Results / Procedures / Treatments   Labs (all labs ordered are listed, but only abnormal results are displayed) Labs Reviewed  URINE DRUG SCREEN, QUALITATIVE ([facility] ONLY) - Abnormal; Notable for the following components:      Result Value   Tricyclic, Ur Screen POSITIVE (*)    Cannabinoid 50 Ng, Ur [place] POSITIVE (*)    All other components within normal limits  URINALYSIS, ROUTINE W REFLEX MICROSCOPIC - Abnormal; Notable for the following components:   Color, Urine YELLOW (*)    APPearance HAZY (*)    Hgb urine dipstick SMALL  (*)    Protein, ur 30 (*)    All other components within normal limits  SALICYLATE LEVEL - Abnormal; Notable for the following components:   Salicylate Lvl <7.0 (*)    All other components within normal limits  ACETAMINOPHEN  LEVEL - Abnormal; Notable for the following components:   Acetaminophen  (Tylenol ), Serum <10 (*)    All other components within normal limits  COMPREHENSIVE METABOLIC PANEL WITH GFR - Abnormal; Notable for the following components:   CO2 20 (*)    Glucose, Bld 109 (*)    AST 13 (*)    All other components within normal limits  CBC WITH DIFFERENTIAL/PLATELET - Abnormal; Notable for the following components:   WBC 10.8 (*)    All other components within normal limits  POC URINE PREG, ED - Abnormal; Notable for the following components:   Preg Test, Ur Negative (*)    All other components within normal limits  ETHANOL  ACETAMINOPHEN  LEVEL  SALICYLATE LEVEL     EKG  I, Marylynn Soho, attending physician, personally viewed and  interpreted this EKG  EKG Time: 1757 Rate: 107 Rhythm: sinus tachycardia Axis: normal Intervals: qtc 471 QRS: narrow ST changes: no st elevation Impression: abnormal ekg  RADIOLOGY I independently interpreted and visualized the CT head. My interpretation: No ICH Radiology interpretation:  IMPRESSION:  No acute intracranial abnormality.      PROCEDURES:  Critical Care performed: Yes  CRITICAL CARE Performed by: Marylynn Soho   Total critical care time: 30 minutes  Critical care time was exclusive of separately billable procedures and treating other patients.  Critical care was necessary to treat or prevent imminent or life-threatening deterioration.  Critical care was time spent personally by me on the following activities: development of treatment plan with patient and/or surrogate as well as nursing, discussions with consultants, evaluation of patient's response to treatment, examination of patient, obtaining  history from patient or surrogate, ordering and performing treatments and interventions, ordering and review of laboratory studies, ordering and review of radiographic studies, pulse oximetry and re-evaluation of patient's condition.   Procedures    MEDICATIONS ORDERED IN ED: Medications - No data to display   IMPRESSION / MDM / ASSESSMENT AND PLAN / ED COURSE  I reviewed the triage vital signs and the nursing notes.                              Differential diagnosis includes, but is not limited to, intentional overdose, ICH, infection  Patient's presentation is most consistent with acute presentation with potential threat to life or bodily function.   The patient is on the cardiac monitor to evaluate for evidence of arrhythmia and/or significant heart rate changes.  Patient presented to the emergency department today unresponsive after apparent suicidal attempt.  On initial arrival to the emergency department patient was unresponsive to all stimuli.  Pinpoint pupils were noted.  She was given 3 mg of Narcan.  This did make her much more responsive although she never fully woke up, she did start responding to physical stimuli.  Blood work here without any concerning abnormalities.  Head CT without intracranial bleed.  Poison control was called although unclear what she might have ingested.  At this time we will plan on observation and symptomatic treatment. Patient was put under IVC.      FINAL CLINICAL IMPRESSION(S) / ED DIAGNOSES   Final diagnoses:  Intentional overdose, initial encounter [facility])  Suicide attempt [facility])        Rx / DC Orders    Note:  This document was prepared using Dragon voice recognition software and may include unintentional dictation errors.    Marylynn Soho, MD 04/26/24 2233

## 2024-04-26 NOTE — ED Notes (Signed)
 Spoke with August Leavens with poison control. She recommends 24 hour watch with cardiac monitoring, serial EKGs q6 hours since pt has wellbutrin and lamictal on patient list and unknown if pt took those, repeat acetaminophen  level 4 hours after initial collection. Hendrick Locke MD made aware.

## 2024-04-26 NOTE — ED Notes (Signed)
 Pt to CT

## 2024-04-27 DIAGNOSIS — R443 Hallucinations, unspecified: Secondary | ICD-10-CM

## 2024-04-27 DIAGNOSIS — T1491XA Suicide attempt, initial encounter: Secondary | ICD-10-CM

## 2024-04-27 DIAGNOSIS — R4182 Altered mental status, unspecified: Secondary | ICD-10-CM

## 2024-04-27 DIAGNOSIS — T50902A Poisoning by unspecified drugs, medicaments and biological substances, intentional self-harm, initial encounter: Secondary | ICD-10-CM

## 2024-04-27 DIAGNOSIS — R000 Tachycardia, unspecified: Secondary | ICD-10-CM | POA: Diagnosis not present

## 2024-04-27 MED ORDER — LORAZEPAM 2 MG/ML IJ SOLN
2.0000 mg | Freq: Once | INTRAMUSCULAR | Status: AC
  Administered 2024-04-27: 2 mg via INTRAMUSCULAR
  Filled 2024-04-27: qty 1

## 2024-04-27 MED ORDER — BUPROPION HCL ER (XL) 150 MG PO TB24
150.0000 mg | ORAL_TABLET | Freq: Every morning | ORAL | Status: DC
  Administered 2024-04-28: 150 mg via ORAL
  Filled 2024-04-27: qty 1

## 2024-04-27 MED ORDER — LORAZEPAM 2 MG/ML IJ SOLN: 2.0000 mg | Freq: Once | INTRAMUSCULAR | Status: DC

## 2024-04-27 MED ORDER — LORAZEPAM 2 MG/ML IJ SOLN
4.0000 mg | Freq: Once | INTRAMUSCULAR | Status: AC
  Administered 2024-04-27: 4 mg via INTRAVENOUS
  Filled 2024-04-27: qty 2

## 2024-04-27 MED ORDER — MIDAZOLAM HCL 2 MG/2ML IJ SOLN
2.0000 mg | Freq: Once | INTRAMUSCULAR | Status: AC
  Administered 2024-04-27: 2 mg via INTRAVENOUS
  Filled 2024-04-27: qty 2

## 2024-04-27 MED ORDER — LAMOTRIGINE 100 MG PO TABS
200.0000 mg | ORAL_TABLET | Freq: Every day | ORAL | Status: DC
  Administered 2024-04-28: 200 mg via ORAL
  Filled 2024-04-27: qty 2

## 2024-04-27 MED ORDER — BUSPIRONE HCL 10 MG PO TABS
15.0000 mg | ORAL_TABLET | Freq: Three times a day (TID) | ORAL | Status: DC
  Administered 2024-04-27 – 2024-04-28 (×2): 15 mg via ORAL
  Filled 2024-04-27 (×2): qty 2

## 2024-04-27 MED ORDER — QUETIAPINE FUMARATE 25 MG PO TABS
300.0000 mg | ORAL_TABLET | Freq: Every day | ORAL | Status: DC
  Administered 2024-04-27: 300 mg via ORAL
  Filled 2024-04-27: qty 4

## 2024-04-27 MED ORDER — LORAZEPAM 2 MG PO TABS
2.0000 mg | ORAL_TABLET | Freq: Once | ORAL | Status: AC
  Administered 2024-04-27: 2 mg via ORAL
  Filled 2024-04-27: qty 1

## 2024-04-27 MED ORDER — HALOPERIDOL LACTATE 5 MG/ML IJ SOLN
5.0000 mg | Freq: Once | INTRAMUSCULAR | Status: AC
  Administered 2024-04-27: 5 mg via INTRAMUSCULAR
  Filled 2024-04-27: qty 1

## 2024-04-27 MED ORDER — CARIPRAZINE HCL 1.5 MG PO CAPS
6.0000 mg | ORAL_CAPSULE | Freq: Every day | ORAL | Status: DC
  Administered 2024-04-28: 6 mg via ORAL
  Filled 2024-04-27: qty 4

## 2024-04-27 NOTE — ED Notes (Signed)
 Pt requested a beverage and was given beverage.

## 2024-04-27 NOTE — BH Assessment (Signed)
 Attempted to assess patient but patient currently unable to participate at this time, patient not yet medically cleared

## 2024-04-27 NOTE — ED Notes (Signed)
 This nt did not dress pt down. This nt is only documenting pt belonging.  Pt belonging: 2 black tshirt[ ripped from the front down] 1 black pant. 1 black boots 1 black hair scrunches. 1 pierce pendant and silver chain 1 silver colored pendant 8 silver small loop earings 1 silver chain 1 big silver loop ring 10 silver studs All jewelries were placed in a urine sample cup and bagged up.

## 2024-04-27 NOTE — ED Notes (Signed)
 Patient out into day room only covered by a towel.  This RN on to unit to ask patient what she is doing.  Patient states she needs to take a shower, explained to patient that showers are offered every morning and then water to the showers are cut off for safety.  This RN requested that patient go back to her room and remember that all room have cameras for patient safety.  Door between 3-bed unit and 5-bed unit closed.
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
## 2024-04-27 NOTE — ED Notes (Signed)
 Pt speaking to someone not present in her room asking if they took her piercing's out.

## 2024-04-27 NOTE — BH Assessment (Signed)
 BH ED ASSESSMENT   Reason for Consult:  Intentional Overdose  Referring Physician:  Marylynn Soho, MD Patient Identification: Megan Saunders MRN:  [id number] ED Chief Complaint: [name] presenting ED via EMS for unresponsiveness. Per EMS report, patient was in her car with family member when she became unresponsive. Patient is unable to give any history. Per reports she does have history of depression and self cutting. She apparently had talked to somebody about maybe taking something. Family believes she might have taken Seroquel although unclear how much.    Diagnosis:  Active Problems:   Intentional overdose (HCC)   Suicide attempt (HCC)   Altered mental status   Hallucinations   ED Assessment Time Calculation: No data recorded  Subjective:   Megan Saunders is a 23 y.o. female patient admitted for intentional overdose.  HPI:    Past Psychiatric History: Per reports she does have history of depression and self cutting.   Risk to Self or Others: Is the patient at risk to self? Yes Has the patient been a risk to self in the past 6 months? Yes Has the patient been a risk to self within the distant past? Yes Is the patient a risk to others? No Has the patient been a risk to others in the past 6 months? No Has the patient been a risk to others within the distant past? No  [place] Scale:  Flowsheet Row ED from 06/18/2023 in [facility] Department at [facility]  C-SSRS RISK CATEGORY No Risk       AIMS:  , , ,  ,   ASAM:    Substance Abuse:     Past Medical History:  Past Medical History:  Diagnosis Date   Anxiety    No past surgical history on file. Family History: No family history on file. Family Psychiatric  History: Not on file Social History:  Social History   Substance and Sexual Activity  Alcohol Use Yes   Alcohol/week: 6.0 standard drinks of alcohol   Types: 2 Glasses of wine, 4 Shots of liquor per week    Comment: Last drink last night (12/14/23) Drinks once monthly     Social History   Substance and Sexual Activity  Drug Use Yes   Frequency: 1.0 times per week   Types: Marijuana    Social History   Socioeconomic History   Marital status: Single    Spouse name: Not on file   Number of children: Not on file   Years of education: Not on file   Highest education level: Not on file  Occupational History   Not on file  Tobacco Use   Smoking status: Every Day    Types: Cigars    Start date: 11/18/2021   Smokeless tobacco: Never  Vaping Use   Vaping status: Never Used  Substance and Sexual Activity   Alcohol use: Yes    Alcohol/week: 6.0 standard drinks of alcohol    Types: 2 Glasses of wine, 4 Shots of liquor per week    Comment: Last drink last night (12/14/23) Drinks once monthly   Drug use: Yes    Frequency: 1.0 times per week    Types: Marijuana   Sexual activity: Yes    Partners: Male, Female    Birth control/protection: Condom, OCP    Comment: Condom use sometimes. Last sex today (12/15/23) w/o condom today.  Other Topics Concern   Not on file  Social History Narrative   **  Merged History Encounter **       Social Drivers of Health   Financial Resource Strain: High Risk (01/25/2023)   Received from [facility], [facility]   Overall Financial Resource Strain (CARDIA)    Difficulty of Paying Living Expenses: Very hard  Food Insecurity: Food Insecurity Present (01/25/2023)   Received from [facility], [facility]   Hunger Vital Sign    Ran Out of Food in the Last Year: Often true    Worried About Running Out of Food in the Last Year: Often true  Transportation Needs: Unmet Transportation Needs (01/25/2023)   Received from [facility], [facility]   [facility] - Transportation    Lack of Transportation (Non-Medical): Yes    In the past 12 months, has lack of  transportation kept you from medical appointments or from getting medications?: Yes  Physical Activity: Sufficiently Active (04/12/2022)   Received from [facility], [facility]   Exercise Vital Sign    Minutes of Exercise per Session: 60 min    Days of Exercise per Week: 7 days  Stress: Stress Concern Present (04/12/2022)   Received from [facility], [facility]   [employer] of Occupational Health - Occupational Stress Questionnaire    Feeling of Stress : Very much  Social Connections: Socially Isolated (04/12/2022)   Received from [facility], [facility]   Social Connection and Isolation Panel [NHANES]    Frequency of Communication with Friends and Family: Once a week    Frequency of Social Gatherings with Friends and Family: Never    Attends Religious Services: Never    Database administrator or Organizations: No    Attends Banker Meetings: Never    Marital Status: Living with partner   Additional Social History:    Allergies:   Allergies  Allergen Reactions   Dust Mite Extract    Grass Extracts [Gramineae Pollens]    Mold Extract [Trichophyton]    Other     Cat and dog dander   Pollen Extract     Labs:  Results for orders placed or performed during the hospital encounter of 04/26/24 (from the past 48 hours)  Ethanol     Status: None   Collection Time: 04/26/24  5:58 PM  Result Value Ref Range   Alcohol, Ethyl (B) <15 <15 mg/dL    Comment: Please note change in reference range. (NOTE) For medical purposes only. Performed at [facility], [address] [place], [place] [address]   Salicylate level     Status: Abnormal   Collection Time: 04/26/24  5:58 PM  Result Value Ref Range   Salicylate Lvl <7.0 (L) 7.0 - 30.0 mg/dL    Comment: Performed at [facility], [address] [place], [place] [address]  Acetaminophen   level     Status: Abnormal   Collection Time: 04/26/24  5:58 PM  Result Value Ref Range   Acetaminophen  (Tylenol ), Serum <10 (L) 10 - 30 ug/mL    Comment: (NOTE) Therapeutic concentrations vary significantly. A range of 10-30 ug/mL  may be an effective concentration for many patients. However, some  are best treated at concentrations outside of this range. Acetaminophen  concentrations >150 ug/mL at 4 hours after ingestion  and >50 ug/mL at 12 hours after ingestion are often associated with  toxic reactions.  Performed at [facility], [address] [place], [place] [address]   Comprehensive metabolic panel     Status: Abnormal   Collection Time: 04/26/24  5:58 PM  Result Value Ref Range   Sodium 139 135 - 145 mmol/L   Potassium 4.1 3.5 - 5.1 mmol/L   Chloride 110 98 - 111 mmol/L   CO2 20 (L) 22 - 32 mmol/L   Glucose, Bld 109 (H) 70 - 99 mg/dL    Comment: Glucose reference range applies only to samples taken after fasting for at least 8 hours.   BUN 8 6 - 20 mg/dL   Creatinine, Ser 6.04 0.44 - 1.00 mg/dL   Calcium 9.2 8.9 - 54.0 mg/dL   Total Protein 7.2 6.5 - 8.1 g/dL   Albumin 4.0 3.5 - 5.0 g/dL   AST 13 (L) 15 - 41 U/L   ALT 14 0 - 44 U/L   Alkaline Phosphatase 56 38 - 126 U/L   Total Bilirubin 0.5 0.0 - 1.2 mg/dL   GFR, Estimated >98 >11 mL/min    Comment: (NOTE) Calculated using the CKD-EPI Creatinine Equation (2021)    Anion gap 9 5 - 15    Comment: Performed at [facility], [address] [place], [place] [address]  CBC with Differential     Status: Abnormal   Collection Time: 04/26/24  5:58 PM  Result Value Ref Range   WBC 10.8 (H) 4.0 - 10.5 K/uL   RBC 4.50 3.87 - 5.11 MIL/uL   Hemoglobin 12.7 12.0 - 15.0 g/dL   HCT 29.5 62.1 - 30.8 %   MCV 87.1 80.0 - 100.0 fL   MCH 28.2 26.0 - 34.0 pg   MCHC 32.4 30.0 - 36.0 g/dL   RDW 65.7 84.6 - 96.2 %   Platelets 310 150 - 400 K/uL   nRBC 0.0 0.0 - 0.2 %   Neutrophils Relative % 56 %   Neutro  Abs 6.2 1.7 - 7.7 K/uL   Lymphocytes Relative 33 %   Lymphs Abs 3.5 0.7 - 4.0 K/uL   Monocytes Relative 6 %   Monocytes Absolute 0.6 0.1 - 1.0 K/uL   Eosinophils Relative 4 %   Eosinophils Absolute 0.4 0.0 - 0.5 K/uL   Basophils Relative 1 %   Basophils Absolute 0.1 0.0 - 0.1 K/uL   Immature Granulocytes 0 %   Abs Immature Granulocytes 0.04 0.00 - 0.07 K/uL    Comment: Performed at [facility], [address] [place], [place] [address]  Urine Drug Screen, Qualitative ([facility] only)     Status: Abnormal   Collection Time: 04/26/24  6:07 PM  Result Value Ref Range   Tricyclic, Ur Screen POSITIVE (A) NONE DETECTED   Amphetamines, Ur Screen NONE DETECTED NONE DETECTED   MDMA (Ecstasy)Ur Screen NONE DETECTED NONE DETECTED   Cocaine Metabolite,Ur [place] NONE DETECTED NONE DETECTED   Opiate, Ur Screen NONE DETECTED NONE DETECTED   Phencyclidine (PCP) Ur S NONE DETECTED NONE DETECTED   Cannabinoid 50 Ng, Ur [place] POSITIVE (A) NONE DETECTED   Barbiturates, Ur Screen NONE DETECTED NONE DETECTED   Benzodiazepine, Ur Scrn NONE DETECTED NONE DETECTED   Methadone Scn, Ur NONE DETECTED NONE DETECTED    Comment: (NOTE) Tricyclics + metabolites, urine    Cutoff 1000 ng/mL Amphetamines + metabolites, urine  Cutoff 1000 ng/mL MDMA (Ecstasy), urine              Cutoff 500 ng/mL Cocaine Metabolite, urine  Cutoff 300 ng/mL Opiate + metabolites, urine        Cutoff 300 ng/mL Phencyclidine (PCP), urine         Cutoff 25 ng/mL Cannabinoid, urine                 Cutoff 50 ng/mL Barbiturates + metabolites, urine  Cutoff 200 ng/mL Benzodiazepine, urine              Cutoff 200 ng/mL Methadone, urine                   Cutoff 300 ng/mL  The urine drug screen provides only a preliminary, unconfirmed analytical test result and should not be used for non-medical purposes. Clinical consideration and professional judgment should be applied to any positive drug screen result due to possible interfering  substances. A more specific alternate chemical method must be used in order to obtain a confirmed analytical result. Gas chromatography / mass spectrometry (GC/MS) is the preferred confirm atory method. Performed at [facility], [address] [place], [place] [address]   Urinalysis, Routine w reflex microscopic -Urine, Catheterized     Status: Abnormal   Collection Time: 04/26/24  6:07 PM  Result Value Ref Range   Color, Urine YELLOW (A) YELLOW   APPearance HAZY (A) CLEAR   Specific Gravity, Urine 1.020 1.005 - 1.030   pH 5.0 5.0 - 8.0   Glucose, UA NEGATIVE NEGATIVE mg/dL   Hgb urine dipstick SMALL (A) NEGATIVE   Bilirubin Urine NEGATIVE NEGATIVE   Ketones, ur NEGATIVE NEGATIVE mg/dL   Protein, ur 30 (A) NEGATIVE mg/dL   Nitrite NEGATIVE NEGATIVE   Leukocytes,Ua NEGATIVE NEGATIVE   RBC / HPF 0-5 0 - 5 RBC/hpf   WBC, UA 6-10 0 - 5 WBC/hpf   Bacteria, UA NONE SEEN NONE SEEN   Squamous Epithelial / HPF 0-5 0 - 5 /HPF   Mucus PRESENT    Hyaline Casts, UA PRESENT     Comment: Performed at [facility], [address] [place], [place] [address]  POC Urine Pregnancy, ED     Status: Abnormal   Collection Time: 04/26/24  6:12 PM  Result Value Ref Range   Preg Test, Ur Negative (A) Negative  Acetaminophen  level     Status: Abnormal   Collection Time: 04/26/24 10:28 PM  Result Value Ref Range   Acetaminophen  (Tylenol ), Serum <10 (L) 10 - 30 ug/mL    Comment: (NOTE) Therapeutic concentrations vary significantly. A range of 10-30 ug/mL  may be an effective concentration for many patients. However, some  are best treated at concentrations outside of this range. Acetaminophen  concentrations >150 ug/mL at 4 hours after ingestion  and >50 ug/mL at 12 hours after ingestion are often associated with  toxic reactions.  Performed at [facility], [address] [place], [place] [address]   Salicylate level     Status: Abnormal   Collection Time: 04/26/24  10:28 PM  Result Value Ref Range   Salicylate Lvl <7.0 (L) 7.0 - 30.0 mg/dL    Comment: Performed at [facility], [address] [place], [place] [address]    No current facility-administered medications for this encounter.   Current Outpatient Medications  Medication Sig Dispense Refill   buPROPion (WELLBUTRIN XL) 150 MG 24 hr tablet Take 150 mg by mouth every morning.     lamoTRIgine (LAMICTAL) 200 MG tablet Take 200 mg by mouth daily.     propranolol (INDERAL) 20 MG tablet Take  20 mg by mouth 2 (two) times daily as needed.     QUEtiapine (SEROQUEL) 300 MG tablet Take 300 mg by mouth at bedtime.     VRAYLAR 6 MG CAPS Take 1 capsule by mouth daily.     buPROPion (WELLBUTRIN XL) 300 MG 24 hr tablet Take 300 mg by mouth daily. (Patient not taking: Reported on 04/26/2024)     busPIRone (BUSPAR) 15 MG tablet Take 15 mg by mouth 3 (three) times daily.     cariprazine (VRAYLAR) 1.5 MG capsule Take 1.5 mg by mouth daily. (Patient not taking: Reported on 04/26/2024)     lamoTRIgine (LAMICTAL) 150 MG tablet Take 300 mg by mouth daily. (Patient not taking: Reported on 04/26/2024)     propranolol (INDERAL) 10 MG tablet Take 10 mg by mouth 3 (three) times daily. (Patient not taking: Reported on 04/26/2024)      Musculoskeletal: Strength & Muscle Tone:  Gait & Station:  Patient leans:    Psychiatric Specialty Exam: Presentation  General Appearance: No data recorded Eye Contact:No data recorded Speech:No data recorded Speech Volume:No data recorded Handedness:No data recorded  Mood and Affect  Mood:No data recorded Affect:No data recorded  Thought Process  Thought Processes:No data recorded Descriptions of Associations:No data recorded Orientation:No data recorded Thought Content:No data recorded History of Schizophrenia/Schizoaffective disorder:No data recorded Duration of Psychotic Symptoms:No data recorded Hallucinations:No data recorded Ideas of Reference:No data  recorded Suicidal Thoughts:No data recorded Homicidal Thoughts:No data recorded  Sensorium  Memory:No data recorded Judgment:No data recorded Insight:No data recorded  Executive Functions  Concentration:No data recorded Attention Span:No data recorded Recall:No data recorded Fund of Knowledge:No data recorded Language:No data recorded  Psychomotor Activity  Psychomotor Activity:No data recorded  Assets  Assets:No data recorded   Sleep  Sleep:No data recorded  Physical Exam: Physical Exam ROS Blood pressure (!) 143/85, pulse (!) 108, temperature 98.1 F (36.7 C), temperature source Oral, resp. rate (!) 21, SpO2 99%. There is no height or weight on file to calculate BMI.  Medical Decision Making: Not addressed  Disposition: Recommend psychiatric Inpatient admission when medically cleared.  Zollie Hipp, Counselor 04/27/2024 6:02 PM

## 2024-04-27 NOTE — BH Assessment (Signed)
..  Per [facility] ([place]), patient to be referred out of system.  Referral information for Inpatient Hospitalization have been faxed to;   [facility] [phone] or [phone])   [facility] [phone]),   [address] [phone]),   [facility] [phone]- [phone]) [phone]  [place] [phone] -or-[phone]) [phone]  [facility] [phone])  [facility]-[phone])   [facility] [phone])  [facility] [phone])

## 2024-04-27 NOTE — BH Assessment (Signed)
 Per psych consult Alba Ally), patient meets criteria for inpatient hospitalization.  However, is not medically cleared and will be referred out to other facilities after clearance.

## 2024-04-27 NOTE — ED Notes (Signed)
 IVC/pending psych consult
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
## 2024-04-27 NOTE — ED Notes (Signed)
 Patient asking were her piercings are, explained to patient that they were locked up with the rest of her belonging.  Patient upset that her some of her piercings were removed and that she is at the ED and stated she did not know she was under IVC and she was leaving.  Explained to patient that she could not leave at this moment. Patient than states to this RN "go the fuck away bitch" and then began to scream loudly.

## 2024-04-27 NOTE — ED Notes (Signed)
 Called into room for suspicious behavior, pt removing self from monitor and restraints. Restraints in place. Denies pain, sx, complaints or needs. Alert, NAD, calm, interactive. Requesting to be covered with blanket, done.

## 2024-04-27 NOTE — ED Notes (Signed)
 Charge nurse was informed.

## 2024-04-27 NOTE — ED Notes (Signed)
 Pt was on the edge of bed attempting to leave room. Pt was uncooperative to verbal commands, but after several attempts, pt got back in bed and was encouraged to rest.

## 2024-04-27 NOTE — Consult Note (Signed)
 [facility] Psychiatric Consult Initial  Patient Name: .Megan Saunders [place]  MRN: [id number]  DOB: [birth year]  Consult Order details:  Orders (From admission, onward)     Start     Ordered   04/26/24 2125  IP CONSULT TO PSYCHIATRY       Ordering Provider: Marylynn Soho, MD  Provider:  (Not yet assigned)  Question Answer Comment  Place call to: psychiatry   Reason for Consult Consult   Diagnosis/Clinical Info for Consult: si/overdose      04/26/24 2124   04/26/24 2125  CONSULT TO CALL ACT TEAM       Ordering Provider: Marylynn Soho, MD  Provider:  (Not yet assigned)  Question:  Reason for Consult?  Answer:  si/overdose   04/26/24 2124             Mode of Visit: Tele-visit Virtual Statement:TELE PSYCHIATRY ATTESTATION & CONSENT As the provider for this telehealth consult, I attest that I verified the patient's identity using two separate identifiers, introduced myself to the patient, provided my credentials, disclosed my location, and performed this encounter via a HIPAA-compliant, real-time, face-to-face, two-way, interactive audio and video platform and with the full consent and agreement of the patient (or guardian as applicable.) Patient physical location: [facility]. Telehealth provider physical location: home office in state of [place].   Video start time: 1447 Video end time: 1500    Psychiatry Consult Evaluation  Service Date: Apr 27, 2024 LOS:  LOS: 0 days  Chief Complaint OD  Primary Psychiatric Diagnoses  Intentional overdose  2.  Auditory hallucination 3.  Behavioral concern in an adult   Assessment  Megan Saunders [place] is a 23 y.o. female admitted: Presented to the [facility] 04/26/2024  5:43 PM for intentional overdose. She carries the psychiatric diagnoses of GAD, borderline personality disorder and has a past medical history of see chart.   .   Diagnoses:  Active Hospital problems: Active Problems:   Intentional overdose (HCC)   Suicide attempt (HCC)   Altered mental  status   Hallucinations    Plan   ## Psychiatric Medication Recommendations:  Continue current home medication,   ## Medical Decision Making Capacity: Not specifically addressed in this encounter  ## Further Work-up:  --  UDS --  ## Disposition:-- We recommend inpatient psychiatric hospitalization when medically cleared. Patient is under voluntary admission status at this time; please IVC if attempts to leave hospital.  ## Behavioral / Environmental: - Patients with borderline personality traits/disorder often use the language of physical pain to communicate both physical and emotional suffering. It is important to address pain complaints as they arise and attempt to identify an etiology, either organic or psychiatric. In patients with chronic pain, it is important to have a discussion with the patient about expectations about pain control. or Patient would benefit from more frequent contact with medical team to delineate plan of care and allow for clarification questions, which will help alleviate anxiety regarding treatment. If possible, try to check back in with the pt in the afternoon.    ## Safety and Observation Level:  - Based on my clinical evaluation, I estimate the patient to be at low risk of self harm in the current setting. - At this time, we recommend  routine. This decision is based on my review of the chart including patient's history and current presentation, interview of the patient, mental status examination, and consideration of suicide risk including evaluating suicidal ideation, plan, intent, suicidal or self-harm behaviors, risk factors,  and protective factors. This judgment is based on our ability to directly address suicide risk, implement suicide prevention strategies, and develop a safety plan while the patient is in the clinical setting. Please contact our team if there is a concern that risk level has changed.  CSSR Risk Category:   Suicide Risk  Assessment: Patient has following modifiable risk factors for suicide: under treated depression , social isolation, recklessness, and medication noncompliance, which we are addressing by psych. Patient has following non-modifiable or demographic risk factors for suicide: history of self harm behavior Patient has the following protective factors against suicide: Supportive family  Thank you for this consult request. Recommendations have been communicated to the primary team.  We will recommend inpatient admission  at this time.   Dorthea Gauze, NP       History of Present Illness  Relevant Aspects of Hospital ED Course:  Admitted on 04/26/2024 for intentional overdose.   Patient Report:  Patient was seen via telemedicine, face-to-face observation of patient, patient is alert and oriented to person and place, maintain minimal eye contact patient is observed in the room very fidgety, unable to stay still and maintain a focus conversation.  Patient keep running from 1 topic to the next.  Patient seemed preoccupied and does seem to be influenced by internal stimuli.  Patient can become emotional while talking with mood spell crying.  Patient however denied current SI but stated that she took a bunch of pills last night patient could not tell the reason why she did it however patient stated that her mom get on her nerves and keep bothering her.  Patient currently denies HI, patient does mention that she saw people and heard voices saying something to her today.  Patient could not say what the voices said to her.  Patient reports she drinks alcohol occasionally.  Reports she smokes cigarettes and also used marijuana occasionally.  Patient denies access to guns at this time.  When asked if she was seeing a psychiatrist patient could not answer appropriately but keep mumbling, it does not seem that patient is compliant with medication regiment.  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  Given patient history borderline personality disorder and her current presentation of intentional overdose.  Would recommend inpatient admission at this time.  Psych ROS:  Depression: Yes Anxiety: Yes Mania (lifetime and current): Unknown Psychosis: (lifetime and current): Yes  Collateral information:  Contacted  Review of Systems  Constitutional: Negative.   HENT: Negative.    Eyes: Negative.   Respiratory: Negative.    Cardiovascular: Negative.   Gastrointestinal: Negative.   Genitourinary: Negative.   Musculoskeletal: Negative.   Neurological: Negative.   Psychiatric/Behavioral:  Positive for depression, hallucinations and suicidal ideas. The patient is nervous/anxious.   All other systems reviewed and are negative.    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 Legal Hx: Unknown Living Situation: Per the patient she lives alone Spiritual Hx: Unknown Access to weapons/lethal means: Patient denies access to guns  Substance History Alcohol: Yes Type of alcohol liquor Last Drink couple weeks ago Number of drinks per day occasionally History of alcohol withdrawal seizures unknown History of DT's unknown Tobacco: Cigarettes Illicit drugs: Marijuana Prescription drug abuse: Unknown Rehab hx:  Unknown  Exam Findings  Physical Exam: See chart Vital Signs:  Temp:  [98.4 F (36.9 C)] 98.4 F (36.9 C) (05/10 1902) Pulse Rate:  [83-152] 152 (05/10 1902) Resp:  [12-22] 16 (05/10 1902) BP: (106-143)/(70-103) 120/78 (05/10 1902) SpO2:  [96 %-100 %] 96 % (05/10 1902) Blood pressure 120/78, pulse (!) 152, temperature 98.4 F (36.9 C), temperature source Oral, resp. rate 16, SpO2 96%. There is no height or weight on file to calculate BMI.  Physical Exam HENT:     Head: Normocephalic.     Nose: Nose normal.  Eyes:     Pupils: Pupils are equal, round, and reactive to light.  Cardiovascular:     Rate and Rhythm: Normal rate.  Pulmonary:     Effort: Pulmonary effort is normal.  Musculoskeletal:        General: Normal range of motion.     Cervical back: Normal range of motion.  Neurological:     General: No focal deficit present.     Mental Status: She is alert.  Psychiatric:        Mood and Affect: Mood normal.        Behavior: Behavior normal.        Thought Content: Thought content normal.        Judgment: Judgment normal.     Mental Status Exam: General Appearance: Fairly Groomed  Orientation:  Full (Time, Place, and Person)  Memory:  Recent;   Fair  Concentration:  Attention Span: Poor  Recall:  Fair  Attention  Poor  Eye Contact:  Minimal  Speech:  Clear and Coherent  Language:  Fair  Volume:  Decreased  Mood: anxious  Affect:  Constricted and Flat  Thought Process:  Linear  Thought Content:  Illogical  Suicidal Thoughts:  Yes.  with intent/plan  Homicidal Thoughts:  No  Judgement:  Poor  Insight:  Shallow  Psychomotor Activity:  Normal  Akathisia:  NA  Fund of Knowledge:  Fair      Assets:  Desire for Improvement Resilience Vocational/Educational  Cognition:  Impaired,  Mild  ADL's:  Intact  AIMS (if indicated):        Other History   These have been pulled in through the EMR, reviewed, and updated if appropriate.  Family History:  The  patient's family history is not on file.  Medical History: Past Medical History:  Diagnosis Date   Anxiety     Surgical History: No past surgical history on file.   Medications:   Current Facility-Administered Medications:    haloperidol lactate (HALDOL) injection 5 mg, 5 mg, Intramuscular, Once, Wells, Achilles Holes, MD   LORazepam (ATIVAN) injection 2 mg, 2 mg, Intramuscular, Once, Karlynn Oyster Achilles Holes, MD  Current Outpatient Medications:    buPROPion (WELLBUTRIN XL) 150 MG 24 hr tablet, Take 150 mg by mouth every morning., Disp: , Rfl:    lamoTRIgine (LAMICTAL) 200 MG tablet, Take 200 mg by mouth daily., Disp: , Rfl:    propranolol (INDERAL) 20 MG tablet, Take 20 mg by mouth 2 (two) times daily as needed., Disp: , Rfl:  QUEtiapine (SEROQUEL) 300 MG tablet, Take 300 mg by mouth at bedtime., Disp: , Rfl:    VRAYLAR 6 MG CAPS, Take 1 capsule by mouth daily., Disp: , Rfl:    buPROPion (WELLBUTRIN XL) 300 MG 24 hr tablet, Take 300 mg by mouth daily. (Patient not taking: Reported on 04/26/2024), Disp: , Rfl:    busPIRone (BUSPAR) 15 MG tablet, Take 15 mg by mouth 3 (three) times daily., Disp: , Rfl:    cariprazine (VRAYLAR) 1.5 MG capsule, Take 1.5 mg by mouth daily. (Patient not taking: Reported on 04/26/2024), Disp: , Rfl:    lamoTRIgine (LAMICTAL) 150 MG tablet, Take 300 mg by mouth daily. (Patient not taking: Reported on 04/26/2024), Disp: , Rfl:    propranolol (INDERAL) 10 MG tablet, Take 10 mg by mouth 3 (three) times daily. (Patient not taking: Reported on 04/26/2024), Disp: , Rfl:   Allergies: Allergies  Allergen Reactions   Dust Mite Extract    Grass Extracts [Gramineae Pollens]    Mold Extract [Trichophyton]    Other     Cat and dog dander   Pollen Extract     Dorthea Gauze, NP

## 2024-04-27 NOTE — ED Provider Notes (Addendum)
-----------------------------------------   3:40 PM on 04/27/2024 -----------------------------------------   Blood pressure (!) 143/85, pulse (!) 108, temperature 98.1 F (36.7 C), temperature source Oral, resp. rate (!) 21, SpO2 99%.  Patient was pending clearance by poison control at start of shift.  Poison control called back and checked in on patient.  They have stated she is clear now from medical standpoint for psychiatric disposition. Psych recommending inpatient admission pending placement.   Kandee Orion, MD 04/27/24 1541  Addendum: Accepted to [facility] for ongoing psychiatric care.  Will occur tomorrow after 9 AM.   Kandee Orion, MD 04/27/24 [phone]

## 2024-04-27 NOTE — ED Notes (Signed)
Pt has been cleared by poison control  

## 2024-04-27 NOTE — ED Notes (Signed)
 Pt changed into scrub top, and gown taken from pt.

## 2024-04-27 NOTE — [facility] Counselor (Signed)
 Patient has been accepted to [facility] Patient assigned to room [place], [address] [place] Accepting physician is Dr. Percilla Boys  Call report to [phone] Representative was [facility]- Marlyce Sine   ER Staff is aware of it:  Kendra Pavy, ER Secretary  Dr. Karlynn Oyster ER MD  Marcelene Sep, Patient's Nurse

## 2024-04-27 NOTE — ED Notes (Signed)
 Danielle, RN from [employer] called back with suggestions to cont to observe pt.

## 2024-04-27 NOTE — ED Notes (Signed)
Patient requesting something to help her sleep. MD notified.

## 2024-04-27 NOTE — ED Notes (Signed)
 Patient pacing in hallway, this RN in to talk with patient and told her that we could leave the door between units opened so she would have more space to walk.  Reminded patient that other 3 patients on unit are female and if she felt more comfortable the door could be closed at any type.

## 2024-04-27 NOTE — ED Notes (Signed)
 Patient given the phone to call her mother an inquire about her piercing's. Pt states that she has multiple piercing's missing, and is also inquiring about her belongings. This RN informed pt that there are no belongings for her are noted on our end. Pt is calm and cooperative at this time.

## 2024-04-28 DIAGNOSIS — Z9151 Personal history of suicidal behavior: Secondary | ICD-10-CM | POA: Diagnosis not present

## 2024-04-28 DIAGNOSIS — R000 Tachycardia, unspecified: Secondary | ICD-10-CM | POA: Diagnosis not present

## 2024-04-28 DIAGNOSIS — Z62810 Personal history of physical and sexual abuse in childhood: Secondary | ICD-10-CM | POA: Diagnosis not present

## 2024-04-28 DIAGNOSIS — R45851 Suicidal ideations: Secondary | ICD-10-CM | POA: Diagnosis not present

## 2024-04-28 DIAGNOSIS — F3181 Bipolar II disorder: Secondary | ICD-10-CM | POA: Diagnosis not present

## 2024-04-28 DIAGNOSIS — F4310 Post-traumatic stress disorder, unspecified: Secondary | ICD-10-CM | POA: Diagnosis not present

## 2024-04-28 DIAGNOSIS — F17210 Nicotine dependence, cigarettes, uncomplicated: Secondary | ICD-10-CM | POA: Diagnosis not present

## 2024-04-28 DIAGNOSIS — T50902A Poisoning by unspecified drugs, medicaments and biological substances, intentional self-harm, initial encounter: Secondary | ICD-10-CM | POA: Diagnosis not present

## 2024-04-28 DIAGNOSIS — T43592A Poisoning by other antipsychotics and neuroleptics, intentional self-harm, initial encounter: Secondary | ICD-10-CM | POA: Diagnosis not present

## 2024-04-28 DIAGNOSIS — R4182 Altered mental status, unspecified: Secondary | ICD-10-CM | POA: Diagnosis not present

## 2024-04-28 DIAGNOSIS — F603 Borderline personality disorder: Secondary | ICD-10-CM | POA: Diagnosis not present

## 2024-04-28 NOTE — ED Notes (Signed)
 Attempted to call report. RN will call this RN back.

## 2024-04-28 NOTE — ED Notes (Signed)
 EMTALA Reviewed by this RN.

## 2024-04-28 NOTE — ED Notes (Signed)
 Patient called for nurse. This RN walked into area to answer question for patient with security. Patient stated "Does he have to be with you?" RN stated "Yes he has to be with me" Patient stated "Never mind then". Patient proceeded to walk back into room. RN and security walked back into nurse's station. Patient had her pants off and only a wine colored scrub top on.

## 2024-04-28 NOTE — ED Notes (Signed)
 Patient continues to walk around 3 bed unit in various states of undress.  When staff requested that she put clothing back on patient shouts "fuck you".

## 2024-04-28 NOTE — ED Notes (Signed)
 Patients belongings given to sheriffs.

## 2024-04-28 NOTE — ED Provider Notes (Signed)
 Emergency Medicine Observation Re-evaluation Note  Megan Saunders [place] is a 23 y.o. female, seen on rounds today.  Pt initially presented to the ED for complaints of Drug Overdose Currently, the patient is resting comfortably, no issues overnight.  Physical Exam  BP 106/76   Pulse (!) 110   Temp 98.4 F (36.9 C)   Resp 17   SpO2 95%  Physical Exam Patient appears well, no acute distress, normal WOB    ED Course / MDM  EKG:   I have reviewed the labs performed to date as well as medications administered while in observation.  Recent changes in the last 24 hours include patient accepted for psych admission.  Plan  Current plan is for transport to [facility] today.    Twilla Galea, MD 04/28/24 [phone]

## 2024-04-28 NOTE — ED Notes (Addendum)
 Patient continues to walk around the 3 bed unit and remains unclothed at this time.  On camera pt appears to be speaking to someone even though no one else in on the unit.

## 2024-04-28 NOTE — ED Notes (Signed)
 ivc/consult done/pt recommended for inpatient psychiatric hospitalization when medically cleared.

## 2024-04-28 NOTE — ED Notes (Signed)
 Patient up to knock on window. Staff went to see what patient needed. Patient walked back into room and did not say anything to staff and is now sitting on her bed.
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
## 2024-04-29 DIAGNOSIS — F333 Major depressive disorder, recurrent, severe with psychotic symptoms: Secondary | ICD-10-CM | POA: Diagnosis not present

## 2024-04-30 DIAGNOSIS — F333 Major depressive disorder, recurrent, severe with psychotic symptoms: Secondary | ICD-10-CM | POA: Diagnosis not present

## 2024-05-01 DIAGNOSIS — F333 Major depressive disorder, recurrent, severe with psychotic symptoms: Secondary | ICD-10-CM | POA: Diagnosis not present

## 2024-05-02 DIAGNOSIS — F333 Major depressive disorder, recurrent, severe with psychotic symptoms: Secondary | ICD-10-CM | POA: Diagnosis not present

## 2024-05-08 DIAGNOSIS — F603 Borderline personality disorder: Secondary | ICD-10-CM | POA: Diagnosis not present

## 2024-05-16 DIAGNOSIS — F603 Borderline personality disorder: Secondary | ICD-10-CM | POA: Diagnosis not present

## 2024-05-19 DIAGNOSIS — Z113 Encounter for screening for infections with a predominantly sexual mode of transmission: Secondary | ICD-10-CM | POA: Diagnosis not present

## 2024-05-21 DIAGNOSIS — F332 Major depressive disorder, recurrent severe without psychotic features: Secondary | ICD-10-CM | POA: Diagnosis not present

## 2024-05-21 DIAGNOSIS — F411 Generalized anxiety disorder: Secondary | ICD-10-CM | POA: Diagnosis not present

## 2024-05-21 DIAGNOSIS — F603 Borderline personality disorder: Secondary | ICD-10-CM | POA: Diagnosis not present

## 2024-05-23 DIAGNOSIS — F603 Borderline personality disorder: Secondary | ICD-10-CM | POA: Diagnosis not present

## 2024-11-19 ENCOUNTER — Other Ambulatory Visit: Payer: Self-pay

## 2024-11-19 DIAGNOSIS — F419 Anxiety disorder, unspecified: Secondary | ICD-10-CM | POA: Insufficient documentation

## 2024-11-19 DIAGNOSIS — F429 Obsessive-compulsive disorder, unspecified: Secondary | ICD-10-CM | POA: Insufficient documentation

## 2024-11-19 DIAGNOSIS — F603 Borderline personality disorder: Secondary | ICD-10-CM | POA: Diagnosis present

## 2024-11-19 DIAGNOSIS — F430 Acute stress reaction: Secondary | ICD-10-CM | POA: Diagnosis not present

## 2024-11-19 LAB — POC URINE PREG, ED: Preg Test, Ur: NEGATIVE
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
## 2024-11-19 NOTE — ED Notes (Addendum)
 Pt belongings:  Multiple piercing's Black jacket Black pants Black boots Phone Black socks White tshirt Vape Black necklace [employer]

## 2024-11-20 ENCOUNTER — Inpatient Hospital Stay
Admission: RE | Admit: 2024-11-20 | Discharge: 2024-11-25 | Disposition: A | Payer: MEDICAID | Source: Intra-hospital | Attending: Psychiatry | Admitting: Psychiatry

## 2024-11-20 ENCOUNTER — Emergency Department
Admission: EM | Admit: 2024-11-20 | Discharge: 2024-11-20 | Disposition: A | Payer: MEDICAID | Attending: Emergency Medicine | Admitting: Emergency Medicine

## 2024-11-20 ENCOUNTER — Other Ambulatory Visit: Payer: Self-pay

## 2024-11-20 ENCOUNTER — Encounter: Payer: Self-pay | Admitting: Psychiatry

## 2024-11-20 DIAGNOSIS — F333 Major depressive disorder, recurrent, severe with psychotic symptoms: Secondary | ICD-10-CM | POA: Diagnosis present

## 2024-11-20 DIAGNOSIS — F603 Borderline personality disorder: Secondary | ICD-10-CM | POA: Diagnosis present

## 2024-11-20 DIAGNOSIS — R4689 Other symptoms and signs involving appearance and behavior: Secondary | ICD-10-CM

## 2024-11-20 LAB — COMPREHENSIVE METABOLIC PANEL WITH GFR
ALT: 17 U/L (ref 0–44)
AST: 13 U/L — ABNORMAL LOW (ref 15–41)
Albumin: 4.2 g/dL (ref 3.5–5.0)
Alkaline Phosphatase: 79 U/L (ref 38–126)
Anion gap: 11 (ref 5–15)
BUN: 10 mg/dL (ref 6–20)
CO2: 25 mmol/L (ref 22–32)
Calcium: 9.5 mg/dL (ref 8.9–10.3)
Chloride: 103 mmol/L (ref 98–111)
Creatinine, Ser: 0.75 mg/dL (ref 0.44–1.00)
GFR, Estimated: >60 mL/min (ref >60)
Glucose, Bld: 103 mg/dL — ABNORMAL HIGH (ref 70–99)
Potassium: 4.1 mmol/L (ref 3.5–5.1)
Sodium: 139 mmol/L (ref 135–145)
Total Bilirubin: <0.2 mg/dL (ref 0.0–1.2)
Total Protein: 7.4 g/dL (ref 6.5–8.1)

## 2024-11-20 LAB — URINE DRUG SCREEN
Amphetamines: NEGATIVE
Barbiturates: NEGATIVE
Benzodiazepines: NEGATIVE
Cocaine: NEGATIVE
Fentanyl: NEGATIVE
Methadone Scn, Ur: NEGATIVE
Opiates: NEGATIVE
Tetrahydrocannabinol: POSITIVE — AB

## 2024-11-20 LAB — SALICYLATE LEVEL: Salicylate Lvl: <7 mg/dL — ABNORMAL LOW (ref 7.0–30.0)

## 2024-11-20 LAB — CBC
HCT: 38.8 % (ref 36.0–46.0)
Hemoglobin: 12.5 g/dL (ref 12.0–15.0)
MCH: 28.1 pg (ref 26.0–34.0)
MCHC: 32.2 g/dL (ref 30.0–36.0)
MCV: 87.2 fL (ref 80.0–100.0)
Platelets: 323 K/uL (ref 150–400)
RBC: 4.45 MIL/uL (ref 3.87–5.11)
RDW: 12.9 % (ref 11.5–15.5)
WBC: 9.8 K/uL (ref 4.0–10.5)
nRBC: 0 % (ref 0.0–0.2)

## 2024-11-20 LAB — ETHANOL: Alcohol, Ethyl (B): <15 mg/dL (ref <15)

## 2024-11-20 LAB — ACETAMINOPHEN LEVEL: Acetaminophen (Tylenol), Serum: <10 ug/mL — ABNORMAL LOW (ref 10–30)

## 2024-11-20 MED ORDER — CARIPRAZINE HCL 1.5 MG PO CAPS: 1.5000 mg | ORAL_CAPSULE | Freq: Once | ORAL | Status: DC

## 2024-11-20 MED ORDER — QUETIAPINE FUMARATE 200 MG PO TABS
400.0000 mg | ORAL_TABLET | Freq: Once | ORAL | Status: AC
  Administered 2024-11-20: 400 mg via ORAL
  Filled 2024-11-20: qty 2

## 2024-11-20 MED ORDER — CARIPRAZINE HCL 1.5 MG PO CAPS: 6.0000 mg | ORAL_CAPSULE | Freq: Once | ORAL | Status: DC

## 2024-11-20 MED ORDER — PROPRANOLOL HCL 20 MG PO TABS
10.0000 mg | ORAL_TABLET | Freq: Once | ORAL | Status: AC
  Administered 2024-11-20: 10 mg via ORAL
  Filled 2024-11-20: qty 1

## 2024-11-20 MED ORDER — HALOPERIDOL 5 MG PO TABS
5.0000 mg | ORAL_TABLET | Freq: Three times a day (TID) | ORAL | Status: DC | PRN
  Administered 2024-11-20: 5 mg via ORAL
  Filled 2024-11-20: qty 1

## 2024-11-20 MED ORDER — BUSPIRONE HCL 5 MG PO TABS
15.0000 mg | ORAL_TABLET | Freq: Once | ORAL | Status: AC
  Administered 2024-11-20: 15 mg via ORAL
  Filled 2024-11-20: qty 3

## 2024-11-20 MED ORDER — QUETIAPINE FUMARATE 200 MG PO TABS: 400.0000 mg | ORAL_TABLET | Freq: Once | ORAL | Status: DC

## 2024-11-20 MED ORDER — LORAZEPAM 2 MG/ML IJ SOLN: 2.0000 mg | Freq: Three times a day (TID) | INTRAMUSCULAR | Status: DC | PRN

## 2024-11-20 MED ORDER — DIPHENHYDRAMINE HCL 25 MG PO CAPS: 25.0000 mg | ORAL_CAPSULE | Freq: Four times a day (QID) | ORAL | Status: DC | PRN

## 2024-11-20 MED ORDER — CARIPRAZINE HCL 1.5 MG PO CAPS
6.0000 mg | ORAL_CAPSULE | Freq: Once | ORAL | Status: AC
  Administered 2024-11-20: 6 mg via ORAL
  Filled 2024-11-20: qty 4

## 2024-11-20 MED ORDER — LAMOTRIGINE 100 MG PO TABS: 200.0000 mg | ORAL_TABLET | Freq: Once | ORAL | Status: DC

## 2024-11-20 MED ORDER — HALOPERIDOL LACTATE 5 MG/ML IJ SOLN: 10.0000 mg | Freq: Three times a day (TID) | INTRAMUSCULAR | Status: DC | PRN

## 2024-11-20 MED ORDER — DIPHENHYDRAMINE HCL 25 MG PO CAPS
50.0000 mg | ORAL_CAPSULE | Freq: Three times a day (TID) | ORAL | Status: DC | PRN
  Administered 2024-11-20: 50 mg via ORAL
  Filled 2024-11-20: qty 2

## 2024-11-20 MED ORDER — MAGNESIUM HYDROXIDE 400 MG/5ML PO SUSP: 30.0000 mL | Freq: Every day | ORAL | Status: DC | PRN

## 2024-11-20 MED ORDER — LAMOTRIGINE 100 MG PO TABS
200.0000 mg | ORAL_TABLET | Freq: Once | ORAL | Status: AC
  Administered 2024-11-20: 200 mg via ORAL
  Filled 2024-11-20: qty 2

## 2024-11-20 MED ORDER — HYDROXYZINE HCL 25 MG PO TABS
25.0000 mg | ORAL_TABLET | Freq: Three times a day (TID) | ORAL | Status: DC | PRN
  Administered 2024-11-20 – 2024-11-21 (×2): 25 mg via ORAL
  Filled 2024-11-20 (×2): qty 1

## 2024-11-20 MED ORDER — BUSPIRONE HCL 5 MG PO TABS: 15.0000 mg | ORAL_TABLET | Freq: Once | ORAL | Status: DC

## 2024-11-20 MED ORDER — DIPHENHYDRAMINE HCL 50 MG/ML IJ SOLN: 50.0000 mg | Freq: Three times a day (TID) | INTRAMUSCULAR | Status: DC | PRN

## 2024-11-20 MED ORDER — HALOPERIDOL LACTATE 5 MG/ML IJ SOLN: 5.0000 mg | Freq: Three times a day (TID) | INTRAMUSCULAR | Status: DC | PRN

## 2024-11-20 MED ORDER — TRAZODONE HCL 50 MG PO TABS
50.0000 mg | ORAL_TABLET | Freq: Every evening | ORAL | Status: DC | PRN
  Administered 2024-11-21 – 2024-11-24 (×5): 50 mg via ORAL
  Filled 2024-11-20 (×4): qty 1

## 2024-11-20 MED ORDER — PROPRANOLOL HCL 20 MG PO TABS: 10.0000 mg | ORAL_TABLET | Freq: Once | ORAL | Status: DC

## 2024-11-20 MED ORDER — ALUM & MAG HYDROXIDE-SIMETH 200-200-20 MG/5ML PO SUSP
30.0000 mL | ORAL | Status: DC | PRN
  Administered 2024-11-24: 30 mL via ORAL
  Filled 2024-11-20: qty 30

## 2024-11-20 MED ORDER — ACETAMINOPHEN 325 MG PO TABS: 650.0000 mg | ORAL_TABLET | Freq: Four times a day (QID) | ORAL | Status: DC | PRN

## 2024-11-20 NOTE — Consult Note (Signed)
 [facility] Psychiatric Consult Initial  Patient Name: .Megan Saunders  MRN: [id number]  DOB: 09/18/2001  Consult Order details:  Orders (From admission, onward)     Start     Ordered   11/20/24 0134  CONSULT TO CALL ACT TEAM       Ordering Provider: Jacolyn Pae, MD  Provider:  (Not yet assigned)  Question:  Reason for Consult?  Answer:  Psych consult   11/20/24 0133   11/20/24 0134  IP CONSULT TO PSYCHIATRY       Ordering Provider: Jacolyn Pae, MD  Provider:  (Not yet assigned)  Question Answer Comment  Reason for consult: Other (see comments)   Comments: IVC      11/20/24 0133             Mode of Visit: Tele-visit Virtual Statement:TELE PSYCHIATRY ATTESTATION & CONSENT As the provider for this telehealth consult, I attest that I verified the patient's identity using two separate identifiers, introduced myself to the patient, provided my credentials, disclosed my location, and performed this encounter via a HIPAA-compliant, real-time, face-to-face, two-way, interactive audio and video platform and with the full consent and agreement of the patient (or guardian as applicable.) Patient physical location: [facility]. Telehealth provider physical location: home office in state of [place] .   Video start time:   Video end time:      Psychiatry Consult Evaluation  Service Date: November 20, 2024 LOS:  LOS: 0 days  Chief Complaint IVC psychiatric eval  Primary Psychiatric Diagnoses  Borderline personality disorder 2.  OCD 3. Anxiety  Assessment  Megan Saunders is a 23 y.o. female admitted: Presented to the EDfor 11/20/2024  1:30 AM for evaluation under IVC after an argument with her mother during which she reportedly made suicidal statements and attempted to open the car door while it was moving. She carries the psychiatric diagnoses of borderline personality disorder and obsessive-compulsive disorder (OCD) and has a past medical history  of a suicide attempt by overdose in May of this year, recent inpatient psychiatric hospitalization in [facility], and significant psychosocial stressors including impending homelessness.  Her current presentation of emotional dysregulation, impulsive behavior (attempting to jump from a moving vehicle), recent suicidal remarks during conflict, and acute interpersonal stress is most consistent with acute stress reaction and mood dysregulation in the setting of borderline personality traits. She meets criteria for continued psychiatric observation pending collateral but not inpatient admission based on current denial of SI/HI/AVH, ability to engage in safety planning, recent psychiatric discharge, supportive outpatient therapy, and her report that the car-door incident was an impulsive reaction rather than an intent to die.   Current outpatient psychotropic medications include Seroquel , Vraylar , propranolol, Buspar , and Vyvanse, and historically she has had a partial response to these medications. She was partially compliant with medications prior to admission as evidenced by her report of active outpatient treatment but significant mood instability and recent behavioral dysregulation.  On initial examination, patient is calm, cooperative, open to discussion, denies current SI/HI/AVH, acknowledges significant stress related to unstable housing, and demonstrates insight into the triggering argument with her mother.  Diagnoses:  Active Hospital problems: Active Problems:   * No active hospital problems. *    Plan   ## Psychiatric Medication Recommendations:  Continue home regimen  ## Medical Decision Making Capacity: Not specifically addressed in this encounter   ## Disposition:-- Pending Collateral  ## Behavioral / Environmental: - Patients with borderline personality traits/disorder often use the language  of physical pain to communicate both physical and emotional suffering. It is important to  address pain complaints as they arise and attempt to identify an etiology, either organic or psychiatric. In patients with chronic pain, it is important to have a discussion with the patient about expectations about pain control.    ## Safety and Observation Level:  - Based on my clinical evaluation, I estimate the patient to be at no risk of self harm in the current setting. - At this time, we recommend  routine. This decision is based on my review of the chart including patient's history and current presentation, interview of the patient, mental status examination, and consideration of suicide risk including evaluating suicidal ideation, plan, intent, suicidal or self-harm behaviors, risk factors, and protective factors. This judgment is based on our ability to directly address suicide risk, implement suicide prevention strategies, and develop a safety plan while the patient is in the clinical setting. Please contact our team if there is a concern that risk level has changed.  CSSR Risk Category:C-SSRS RISK CATEGORY: No Risk  Suicide Risk Assessment: Patient has following modifiable risk factors for suicide: triggering events, which we are addressing by continued observation. Patient has following non-modifiable or demographic risk factors for suicide: history of self harm behavior Patient has the following protective factors against suicide: Supportive family  Thank you for this consult request. Recommendations have been communicated to the primary team.  We will recommend continued observation pending collateral at this time.   Ellen Goris, NP       History of Present Illness  Relevant Aspects of Hospital ED Course:  Admitted on 11/20/2024 for IVC psych eval.  Patient Report:  The patient is a 23 year old female brought to the ED by [place] [employer] under IVC after an argument with her mother. She reports that the disagreement started because her mother was texting her boyfriend, who has  not been responding to her. She denies making threats toward her mother and states the argument was verbal and also involved discussion about her cats, which are currently being cared for by a relative.  She acknowledges that during the argument she opened the car door while the vehicle was moving, but denies suicidal intent, stating she did it because she felt like she was being "held hostage" when her mother said she was taking her to the courthouse. She denies that she meant to harm herself.  Per collateral paperwork, the patient reportedly made suicidal remarks during the conflict. She is currently denying suicidal ideation, homicidal ideation, and auditory or visual hallucinations.  The patient reports a history of a suicide attempt by overdose in May of this year. She states she carries diagnoses of borderline personality disorder and OCD. She participates in outpatient therapy every Thursday and identifies ongoing treatment with multiple psychotropic medications, including Seroquel , Vraylar , propranolol, Buspar , and Vyvanse.  She reports significant current stressors, particularly related to housing insecurity. She is living with her father, who has expressed that he no longer wants her to live with him, leaving her at risk of homelessness.  Patient denies alcohol or drug use and is cooperative and engaged during the evaluation.  Psych ROS:  Depression: Denies Anxiety: yes Mania (lifetime and current): No Psychosis: (lifetime and current): No   Review of Systems  Constitutional: Negative.   HENT: Negative.    Eyes: Negative.   Respiratory: Negative.    Cardiovascular: Negative.   Gastrointestinal: Negative.   Genitourinary: Negative.   Musculoskeletal: Negative.  Skin: Negative.   Neurological: Negative.      Psychiatric and Social History  Psychiatric History:  Information collected from patient and chart history  Prev Dx/Sx: OCD, BPD Current Psych Provider: Not  provided Home Meds (current): Seroquel  Vraylar  Lamictal  propranolol BuSpar  and Vyvanse Previous Med Trials: Not reported Therapy: Yes  Prior Psych Hospitalization: Yes Prior Self Harm: Yes Prior Violence: No  Family Psych History: Not reported Family Hx suicide: Not reported  Social History:  Developmental Hx: Unknown Educational Hx: Unknown Occupational Hx: Building Services Engineer Hx: Unknown Living Situation: With mother Spiritual Hx: Unknown Access to weapons/lethal means: No  Substance History Alcohol: Denies Tobacco: Denies Illicit drugs: Marijuana Prescription drug abuse: Denies Rehab hx: Denies  Exam Findings   Vital Signs:  Temp:  [98 F (36.7 C)] 98 F (36.7 C) (12/02 2325) Pulse Rate:  [101-113] 101 (12/03 0235) Resp:  [20] 20 (12/02 2325) BP: (135-144)/(94-95) 135/95 (12/03 0235) SpO2:  [100 %] 100 % (12/02 2325) Weight:  [117.9 kg] 117.9 kg (12/02 2324) Blood pressure (!) 135/95, pulse (!) 101, temperature 98 F (36.7 C), resp. rate 20, height 5' 9 (1.753 m), weight 117.9 kg, SpO2 100%. Body mass index is 38.4 kg/m.  Physical Exam HENT:     Head: Normocephalic.     Nose: Nose normal.     Mouth/Throat:     Pharynx: Oropharynx is clear.  Eyes:     Extraocular Movements: Extraocular movements intact.  Pulmonary:     Effort: Pulmonary effort is normal.  Musculoskeletal:        General: Normal range of motion.     Cervical back: Normal range of motion.  Skin:    General: Skin is dry.  Neurological:     General: No focal deficit present.     Mental Status: She is alert.      Other History   These have been pulled in through the EMR, reviewed, and updated if appropriate.  Family History:  The patient's family history is not on file.  Medical History: Past Medical History:  Diagnosis Date   Anxiety     Surgical History: History reviewed. No pertinent surgical history.   Medications:  No current facility-administered medications for this  encounter.  Current Outpatient Medications:    buPROPion  (WELLBUTRIN  XL) 150 MG 24 hr tablet, Take 150 mg by mouth every morning., Disp: , Rfl:    buPROPion  (WELLBUTRIN  XL) 300 MG 24 hr tablet, Take 300 mg by mouth daily. (Patient not taking: Reported on 04/26/2024), Disp: , Rfl:    busPIRone  (BUSPAR ) 15 MG tablet, Take 15 mg by mouth 3 (three) times daily., Disp: , Rfl:    cariprazine  (VRAYLAR ) 1.5 MG capsule, Take 1.5 mg by mouth daily. (Patient not taking: Reported on 04/26/2024), Disp: , Rfl:    lamoTRIgine  (LAMICTAL ) 150 MG tablet, Take 300 mg by mouth daily. (Patient not taking: Reported on 04/26/2024), Disp: , Rfl:    lamoTRIgine  (LAMICTAL ) 200 MG tablet, Take 200 mg by mouth daily., Disp: , Rfl:    propranolol (INDERAL) 10 MG tablet, Take 10 mg by mouth 3 (three) times daily. (Patient not taking: Reported on 04/26/2024), Disp: , Rfl:    propranolol (INDERAL) 20 MG tablet, Take 20 mg by mouth 2 (two) times daily as needed., Disp: , Rfl:    QUEtiapine  (SEROQUEL ) 300 MG tablet, Take 300 mg by mouth at bedtime., Disp: , Rfl:    VRAYLAR  6 MG CAPS, Take 1 capsule by mouth daily., Disp: , Rfl:  Allergies: Allergies  Allergen Reactions   Dust Mite Extract    Grass Extracts [Gramineae Pollens]    Mold Extract [Trichophyton]    Other     Cat and dog dander   Pollen Extract     Jon Aldrich, NP

## 2024-11-20 NOTE — ED Notes (Signed)
Pt given breakfast tray and apple juice.

## 2024-11-20 NOTE — BH Assessment (Signed)
 Writer contacted patient's mom Shante Allocca [phone]. Mom states patient was released from [facility] on yesterday after spending 8 days at the facility. Mom says on their way home, patient became upset, verbally abusive, screaming to top of her lungs and behaving as if she was possessed then attempted to jump out the car while they were on the highway after reading a text message from her boyfriend. Mom reports patient was positive for fentanyl and PCP in which she believes came from patient vaping. Mom reports patient is currently homeless and can no longer live with her or her father. Mom says she doesn't know what else to do and believes patient needs a fresh start somewhere else. Mom states she is not sure when it will happen but another family member(cousin) suggested patient come to [place] and live with her. As of right now, mom states she does not feel comfortable picking up patient and is worried for the patient's safety as well as her own.

## 2024-11-20 NOTE — Progress Notes (Signed)
   11/20/24 1945  Psych Admission Type (Psych Patients Only)  Admission Status Involuntary  Psychosocial Assessment  Patient Complaints Anxiety  Eye Contact Brief  Facial Expression Anxious  Affect Apathetic  Speech Logical/coherent  Interaction Assertive  Motor Activity Other (Comment) (WNL)  Appearance/Hygiene Unremarkable  Behavior Characteristics Unwilling to participate  Mood Anxious  Thought Process  Coherency WDL  Content WDL  Delusions None reported or observed  Perception WDL  Hallucination None reported or observed  Judgment Impaired  Confusion WDL  Danger to Self  Current suicidal ideation? Denies  Danger to Others  Danger to Others None reported or observed

## 2024-11-20 NOTE — ED Notes (Signed)
 Report given to [employer]

## 2024-11-20 NOTE — BH Assessment (Signed)
 Patient is to be admitted to [facility] on today 11/20/24 by Dr. Jadapalle.  Attending Physician will be Dr. Jadapalle.   Patient has been assigned to room 309, by [facility] Charge Nurse Leita.    ER staff is aware of the admission: Ellouise, ER Secretary   Dr. Ernest, ER MD  Alfonso, Patient's Nurse  Curtistine, Patient Access.

## 2024-11-20 NOTE — Progress Notes (Signed)
 23 year old female patient admitted for MDD.On assessment patient asking to go home. Patient states  my mom just put me here for no reason. I need to get the shit back together. I need to get out from here today. Patient started screaming,hyper ventilating and shaking. PRN medications given. Skin assessment and body search done. No contraband found.Patient calm down after sometime. Oriented to unit and made comfortable. Support and encouragement given.

## 2024-11-20 NOTE — ED Provider Notes (Signed)
 [facility] Provider Note    Event Date/Time   First MD Initiated Contact with Patient 11/20/24 0133     (approximate)   History   Psychiatric Evaluation   HPI  Megan Saunders is a 23 y.o. female with history of hypertension who presents with apparent suicidal ideation.  Per the IVC paperwork, the patient told her mother that she was suicidal.  She then attempted to jump out of a moving vehicle.  The patient denies SI or HI.  She states that her mother placed her under commitment after an argument.  The patient also denies any acute medical complaints.  I reviewed the past medical records.  The patient's most recent outpatient encounter was at the [facility] ED on 11/30 for a laceration after a fall.  She was evaluated by psychiatry here on 5/10 of this year after presenting with an intentional overdose.   Physical Exam   Triage Vital Signs: ED Triage Vitals  Encounter Vitals Group     BP 11/19/24 2325 (!) 144/94     Girls Systolic BP Percentile --      Girls Diastolic BP Percentile --      Boys Systolic BP Percentile --      Boys Diastolic BP Percentile --      Pulse Rate 11/19/24 2325 (!) 113     Resp 11/19/24 2325 20     Temp 11/19/24 2325 98 F (36.7 C)     Temp src --      SpO2 11/19/24 2325 100 %     Weight 11/19/24 2324 260 lb (117.9 kg)     Height 11/19/24 2324 5' 9 (1.753 m)     Head Circumference --      Peak Flow --      Pain Score 11/19/24 2324 0     Pain Loc --      Pain Education --      Exclude from Growth Chart --     Most recent vital signs: Vitals:   11/19/24 2325 11/20/24 0235  BP: (!) 144/94 (!) 135/95  Pulse: (!) 113 (!) 101  Resp: 20   Temp: 98 F (36.7 C)   SpO2: 100%      General: Awake, no distress.  CV:  Good peripheral perfusion.  Resp:  Normal effort.  Abd:  No distention.  Other:  Calm and cooperative.   ED Results / Procedures / Treatments   Labs (all labs ordered are listed, but only abnormal  results are displayed) Labs Reviewed  COMPREHENSIVE METABOLIC PANEL WITH GFR - Abnormal; Notable for the following components:      Result Value   Glucose, Bld 103 (*)    AST 13 (*)    All other components within normal limits  URINE DRUG SCREEN - Abnormal; Notable for the following components:   Tetrahydrocannabinol POSITIVE (*)    All other components within normal limits  SALICYLATE LEVEL - Abnormal; Notable for the following components:   Salicylate Lvl <7.0 (*)    All other components within normal limits  ACETAMINOPHEN  LEVEL - Abnormal; Notable for the following components:   Acetaminophen  (Tylenol ), Serum <10 (*)    All other components within normal limits  ETHANOL  CBC  POC URINE PREG, ED     EKG    RADIOLOGY    PROCEDURES:  Critical Care performed: No  Procedures   MEDICATIONS ORDERED IN ED: Medications  busPIRone  (BUSPAR ) tablet 15 mg (15 mg Oral Given 11/20/24 0234)  lamoTRIgine  (LAMICTAL ) tablet 200 mg (200 mg Oral Given 11/20/24 0234)  propranolol  (INDERAL ) tablet 10 mg (10 mg Oral Given 11/20/24 0235)  QUEtiapine  (SEROQUEL ) tablet 400 mg (400 mg Oral Given 11/20/24 0235)  cariprazine  (VRAYLAR ) capsule 6 mg (6 mg Oral Given 11/20/24 0400)     IMPRESSION / MDM / ASSESSMENT AND PLAN / ED COURSE  I reviewed the triage vital signs and the nursing notes.  23 year old female with PMH as noted above presents under IVC with apparent SI, although she denies SI currently.  Differential diagnosis includes, but is not limited to, major depressive disorder, adjustment disorder, substance-induced mood disorder.  We will obtain lab workup for medical clearance.  I have ordered psychiatry and TTS consults for further evaluation.  Patient's presentation is most consistent with acute presentation with potential threat to life or bodily function.  The patient has been placed in psychiatric observation due to the need to provide a safe environment for the patient while  obtaining psychiatric consultation and evaluation, as well as ongoing medical and medication management to treat the patient's condition.  The patient has been placed under full IVC at this time.    ----------------------------------------- 7:27 AM on 11/20/2024 -----------------------------------------  CMP and CBC show no acute findings.  UDS is positive for THC.  I consulted and discussed the case with the psychiatry provider who advised that the patient should be observed while collateral is obtained.  I have signed her out to the oncoming ED physician Dr. Ernest.   FINAL CLINICAL IMPRESSION(S) / ED DIAGNOSES   Final diagnoses:  Behavior concern     Rx / DC Orders   ED Discharge Orders     None        Note:  This document was prepared using Dragon voice recognition software and may include unintentional dictation errors.    Jacolyn Pae, MD 11/20/24 [phone]

## 2024-11-20 NOTE — ED Notes (Signed)
 ivc prior to arrival/psych consult ordered/pending.Marland KitchenMarland Kitchen

## 2024-11-20 NOTE — ED Notes (Signed)
 Pt provided with lunch tray. Pt is sitting up eating.

## 2024-11-20 NOTE — ED Notes (Addendum)
 Attempted to call report; admission nurse to call back when available

## 2024-11-20 NOTE — Tx Team (Signed)
 Initial Treatment Plan 11/20/2024 4:59 PM [name] FMW:[id number]    PATIENT STRESSORS: Educational concerns   Financial difficulties   Marital or family conflict   Traumatic event     PATIENT STRENGTHS: Average or above average intelligence  Manufacturing systems engineer  Work skills    PATIENT IDENTIFIED PROBLEMS: Altercation with family  Impulsivity   Financial stress  Homelessness               DISCHARGE CRITERIA:  Medical problems require only outpatient monitoring Motivation to continue treatment in a less acute level of care Verbal commitment to aftercare and medication compliance  PRELIMINARY DISCHARGE PLAN: Attend aftercare/continuing care group Return to previous living arrangement Return to previous work or school arrangements  PATIENT/FAMILY INVOLVEMENT: This treatment plan has been presented to and reviewed with the patient, Megan Saunders, and/or family member  The patient and family have been given the opportunity to ask questions and make suggestions.  Dollie Zachary Sandifer, RN 11/20/2024, 4:59 PM

## 2024-11-20 NOTE — Group Note (Signed)
 Date:  11/20/2024 Time:  11:08 PM  Group Topic/Focus:  Wellness Toolbox:   The focus of this group is to discuss various aspects of wellness, balancing those aspects and exploring ways to increase the ability to experience wellness.  Patients will create a wellness toolbox for use upon discharge.    Participation Level:  Did Not Attend  Participation Quality:  none  Affect:  none  Cognitive:  none  Insight: None  Engagement in Group:  none  Modes of Intervention:  none  Additional Comments:  none   Kerri Katz 11/20/2024, 11:08 PM

## 2024-11-20 NOTE — Plan of Care (Signed)
   Problem: Education: Goal: Emotional status will improve Outcome: Progressing Goal: Mental status will improve Outcome: Progressing
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
## 2024-11-21 LAB — LIPID PANEL
Cholesterol: 174 mg/dL (ref 0–200)
HDL: 66 mg/dL (ref >40)
LDL Cholesterol: 78 mg/dL (ref 0–99)
Total CHOL/HDL Ratio: 2.7 ratio
Triglycerides: 152 mg/dL — ABNORMAL HIGH (ref <150)
VLDL: 30 mg/dL (ref 0–40)

## 2024-11-21 LAB — HEMOGLOBIN A1C
Hgb A1c MFr Bld: 5.7 % — ABNORMAL HIGH (ref 4.8–5.6)
Mean Plasma Glucose: 117 mg/dL

## 2024-11-21 MED ORDER — CARIPRAZINE HCL 1.5 MG PO CAPS
6.0000 mg | ORAL_CAPSULE | Freq: Every day | ORAL | Status: DC
  Administered 2024-11-21 – 2024-11-25 (×5): 6 mg via ORAL
  Filled 2024-11-21 (×5): qty 4

## 2024-11-21 MED ORDER — PROPRANOLOL HCL 20 MG PO TABS
10.0000 mg | ORAL_TABLET | Freq: Three times a day (TID) | ORAL | Status: DC
  Administered 2024-11-21: 10 mg via ORAL
  Filled 2024-11-21: qty 1

## 2024-11-21 MED ORDER — BUSPIRONE HCL 5 MG PO TABS
15.0000 mg | ORAL_TABLET | Freq: Three times a day (TID) | ORAL | Status: DC
  Administered 2024-11-21: 15 mg via ORAL
  Filled 2024-11-21: qty 3

## 2024-11-21 MED ORDER — BUSPIRONE HCL 5 MG PO TABS
15.0000 mg | ORAL_TABLET | Freq: Three times a day (TID) | ORAL | Status: DC
  Administered 2024-11-21 – 2024-11-25 (×12): 15 mg via ORAL
  Filled 2024-11-21 (×13): qty 3

## 2024-11-21 MED ORDER — PROPRANOLOL HCL 20 MG PO TABS
10.0000 mg | ORAL_TABLET | Freq: Three times a day (TID) | ORAL | Status: DC
  Administered 2024-11-21 – 2024-11-25 (×12): 10 mg via ORAL
  Filled 2024-11-21 (×13): qty 1

## 2024-11-21 MED ORDER — QUETIAPINE FUMARATE 200 MG PO TABS
400.0000 mg | ORAL_TABLET | Freq: Every day | ORAL | Status: DC
  Administered 2024-11-21 – 2024-11-24 (×4): 400 mg via ORAL
  Filled 2024-11-21 (×4): qty 2

## 2024-11-21 MED ORDER — LAMOTRIGINE 100 MG PO TABS
200.0000 mg | ORAL_TABLET | Freq: Every day | ORAL | Status: DC
  Administered 2024-11-21 – 2024-11-25 (×5): 200 mg via ORAL
  Filled 2024-11-21 (×6): qty 2

## 2024-11-21 NOTE — [facility] Suicide Risk Assessment (Signed)
 [facility] INPATIENT:  Family/Significant Other Suicide Prevention Education  Suicide Prevention Education:  Contact Attempts: [name], [phone], Father, has been identified by the patient as the family member/significant other with whom the patient will be residing, and identified as the person(s) who will aid the patient in the event of a mental health crisis.  With written consent from the patient, two attempts were made to provide suicide prevention education, prior to and/or following the patient's discharge.  We were unsuccessful in providing suicide prevention education.  A suicide education pamphlet was given to the patient to share with family/significant other.  Date and time of first attempt:11/21/24 at 3:00 PM  Date and time of second attempt: Second attempt needed.   Consuela Widener M Carlin Attridge 11/21/2024, 2:41 PM

## 2024-11-21 NOTE — Group Note (Signed)
 Recreation Therapy Group Note   Group Topic:Relaxation  Group Date: 11/21/2024 Start Time: 1000 End Time: 1045 Facilitators: Celestia Jeoffrey BRAVO, LRT, CTRS Location: Craft Room  Group Description: PMR (Progressive Muscle Relaxation). LRT asks patients their current level of stress/anxiety from 1-10, with 10 being the highest. LRT educates patients on what PMR is and the benefits that come from it. Patients are asked to sit with their feet flat on the floor while sitting up and all the way back in their chair, if possible. LRT and pts follow a prompt through a speaker that requires you to tense and release different muscles in their body and focus on their breathing. During session, lights are off and soft music is being played. Pts are given a stress ball to use if needed. At the end of the prompt, LRT asks patients to rank their current levels of stress/anxiety from 1-10, 10 being the highest. LRT provides patients with an education handout on PMR.   Goal Area(s) Addressed:  Patients will be able to describe progressive muscle relaxation.  Patient will practice using relaxation technique. Patient will identify a new coping skill.  Patient will follow multistep directions to reduce anxiety and stress.   Affect/Mood: Flat   Participation Level: Non-verbal   Participation Quality: Independent   Behavior: Cooperative   Speech/Thought Process: N/A   Insight: Fair   Judgement: Fair    Modes of Intervention: Activity, Education, and Exploration   Patient Response to Interventions:  Receptive   Education Outcome:  In group clarification offered    Clinical Observations/Individualized Feedback: Denaisha was active in their participation of session activities and group discussion. Pt identified that her anxiety and stress were both a 10 before and after the session.    Plan: Continue to engage patient in RT group sessions 2-3x/week.   Jeoffrey BRAVO Celestia, LRT, CTRS 11/21/2024 1:17 PM
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
## 2024-11-21 NOTE — Group Note (Signed)
 Date:  11/21/2024 Time:  10:17 AM  Group Topic/Focus:  Goals Group:   The focus of this group is to help patients establish daily goals to achieve during treatment and discuss how the patient can incorporate goal setting into their daily lives to aide in recovery.    Participation Level:  Did Not Attend   [name] [facility] 11/21/2024, 10:17 AM

## 2024-11-21 NOTE — Plan of Care (Signed)
  Problem: Education: Goal: Mental status will improve Outcome: Progressing   

## 2024-11-21 NOTE — Progress Notes (Signed)
 Pt is A &O, flat and depressed affect,   11/21/24 0900  Psych Admission Type (Psych Patients Only)  Admission Status Involuntary  Psychosocial Assessment  Patient Complaints Anxiety;Depression (anxiety 8/10 Depression 4/10)  Eye Contact Brief  Facial Expression Anxious  Affect Blunted  Speech Logical/coherent  Interaction Superficial  Motor Activity Other (Comment) (WNL)  Appearance/Hygiene Unremarkable  Behavior Characteristics Guarded  Mood Anxious  Thought Process  Coherency WDL  Content WDL  Delusions None reported or observed  Perception WDL  Hallucination None reported or observed  Judgment Impaired  Confusion None  Danger to Self  Current suicidal ideation? Denies  Danger to Others  Danger to Others None reported or observed

## 2024-11-21 NOTE — Group Note (Signed)
 Recreation Therapy Group Note   Group Topic:Stress Management  Group Date: 11/21/2024 Start Time: 1530 End Time: 1620 Facilitators: Celestia Jeoffrey FORBES ARTICE, CTRS Location: Craft Room  Group Description: Taboo. LRT and patients played the game Taboo. The object of the game is to have peers guess the word up at the top of the card drawn that is in bold, while being sure to not use any of the descriptive words down below it on the same card. If the person attempting to explain the word in bold uses any of the descriptive words down below, they lose their turn, and no one receives that card or point. LRT and patient's took turns being the one to describe the words while the rest of the group tried to guess what they were describing.   Goal Area(s) Addressed: Patient will identify physical symptoms of stress. Patient will identify emotional symptoms of stress. Patient will identify coping skills for stress. Patient will build frustration tolerance skills.  Patient will increase communication.   Affect/Mood: N/A   Participation Level: Did not attend    Clinical Observations/Individualized Feedback: Patient did not attend group.   Plan: Continue to engage patient in RT group sessions 2-3x/week.   Jeoffrey FORBES Celestia, LRT, CTRS 11/21/2024 5:24 PM
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
## 2024-11-21 NOTE — Group Note (Signed)
 Date:  11/21/2024 Time:  9:13 PM  Group Topic/Focus:  Self Care:   The focus of this group is to help patients understand the importance of self-care in order to improve or restore emotional, physical, spiritual, interpersonal, and financial health.    Pt did not attend group.  Curlie Macken L 11/21/2024, 9:13 PM

## 2024-11-21 NOTE — Group Note (Signed)
 [facility] LCSW Group Therapy Note   Group Date: 11/21/2024 Start Time: 1300 End Time: 1400   Type of Therapy/Topic:  Group Therapy:  Balance in Life  Participation Level:  None   Description of Group:    This group will address the concept of balance and how it feels and looks when one is unbalanced. Patients will be encouraged to process areas in their lives that are out of balance, and identify reasons for remaining unbalanced. Facilitators will guide patients utilizing problem- solving interventions to address and correct the stressor making their life unbalanced. Understanding and applying boundaries will be explored and addressed for obtaining  and maintaining a balanced life. Patients will be encouraged to explore ways to assertively make their unbalanced needs known to significant others in their lives, using other group members and facilitator for support and feedback.  Therapeutic Goals: Patient will identify two or more emotions or situations they have that consume much of in their lives. Patient will identify signs/triggers that life has become out of balance:  Patient will identify two ways to set boundaries in order to achieve balance in their lives:  Patient will demonstrate ability to communicate their needs through discussion and/or role plays  Summary of Patient Progress: Patient came in at the end of group. She did not participate in the discussion but her behavior was appropriate while in the group space.   Therapeutic Modalities:   Cognitive Behavioral Therapy Solution-Focused Therapy Assertiveness Training   Nadara JONELLE Fam, LCSW

## 2024-11-22 NOTE — Group Note (Signed)
 Recreation Therapy Group Note   Group Topic:Leisure Education  Group Date: 11/22/2024 Start Time: 1025 End Time: 1125 Facilitators: Celestia Jeoffrey BRAVO, LRT, CTRS Location: Craft Room  Group Description: Leisure. Patients were given the option to choose from journaling, coloring, drawing, making origami, playing with playdoh, listening to music or singing karaoke. LRT and pts discussed the meaning of leisure, the importance of participating in leisure during their free time/when they're outside of the hospital, as well as how our leisure interests can also serve as coping skills.  Goal Area(s) Addressed:  Patient will identify a current leisure interest.  Patient will learn the definition of "leisure". Patient will practice making a positive decision. Patient will have the opportunity to try a new leisure activity. Patient will communicate with peers and LRT.    Affect/Mood: Appropriate   Participation Level: Active and Engaged   Participation Quality: Independent   Behavior: Calm and Cooperative   Speech/Thought Process: Loose association   Insight: Fair   Judgement: Fair    Modes of Intervention: Education, Exploration, Music, Rapport Building, and Socialization   Patient Response to Interventions:  Receptive   Education Outcome:  In group clarification offered    Clinical Observations/Individualized Feedback: Megan Saunders was active in their participation of session activities and group discussion. Pt identified find roadkill and art as things she does in her free time.   Pt then shared that she will burry the body and collect the bones to make jewelry.    Plan: Continue to engage patient in RT group sessions 2-3x/week.   Jeoffrey BRAVO Celestia, LRT, CTRS 11/22/2024 12:25 PM

## 2024-11-22 NOTE — BH IP Treatment Plan (Signed)
 Interdisciplinary Treatment and Diagnostic Plan Update  11/22/2024 Time of Session: 10:01AM Megan Saunders MRN: [id number]  Principal Diagnosis: MDD (major depressive disorder), recurrent, severe, with psychosis (HCC)  Secondary Diagnoses: Principal Problem:   MDD (major depressive disorder), recurrent, severe, with psychosis (HCC)   Current Medications:  Current Facility-Administered Medications  Medication Dose Route Frequency Provider Last Rate Last Admin   acetaminophen  (TYLENOL ) tablet 650 mg  650 mg Oral Q6H PRN Jadapalle, Sree, MD       alum & mag hydroxide-simeth (MAALOX/MYLANTA) 200-200-20 MG/5ML suspension 30 mL  30 mL Oral Q4H PRN Jadapalle, Sree, MD       busPIRone  (BUSPAR ) tablet 15 mg  15 mg Oral TID Merrill, Kristin A, RPH   15 mg at 11/22/24 9163   cariprazine  (VRAYLAR ) capsule 6 mg  6 mg Oral Daily Jadapalle, Sree, MD   6 mg at 11/22/24 9164   diphenhydrAMINE  (BENADRYL ) capsule 25 mg  25 mg Oral Q6H PRN Jadapalle, Sree, MD       haloperidol  (HALDOL ) tablet 5 mg  5 mg Oral TID PRN Jadapalle, Sree, MD   5 mg at 11/20/24 1601   And   diphenhydrAMINE  (BENADRYL ) capsule 50 mg  50 mg Oral TID PRN Jadapalle, Sree, MD   50 mg at 11/20/24 1601   haloperidol  lactate (HALDOL ) injection 5 mg  5 mg Intramuscular TID PRN Jadapalle, Sree, MD       And   diphenhydrAMINE  (BENADRYL ) injection 50 mg  50 mg Intramuscular TID PRN Jadapalle, Sree, MD       And   LORazepam  (ATIVAN ) injection 2 mg  2 mg Intramuscular TID PRN Jadapalle, Sree, MD       haloperidol  lactate (HALDOL ) injection 10 mg  10 mg Intramuscular TID PRN Jadapalle, Sree, MD       And   diphenhydrAMINE  (BENADRYL ) injection 50 mg  50 mg Intramuscular TID PRN Jadapalle, Sree, MD       And   LORazepam  (ATIVAN ) injection 2 mg  2 mg Intramuscular TID PRN Jadapalle, Sree, MD       hydrOXYzine  (ATARAX ) tablet 25 mg  25 mg Oral TID PRN Jadapalle, Sree, MD   25 mg at 11/21/24 0857   lamoTRIgine  (LAMICTAL ) tablet 200 mg  200 mg  Oral Daily Jadapalle, Sree, MD   200 mg at 11/22/24 9163   magnesium  hydroxide (MILK OF MAGNESIA) suspension 30 mL  30 mL Oral Daily PRN Donnelly Mellow, MD       propranolol  (INDERAL ) tablet 10 mg  10 mg Oral TID Merrill, Kristin A, RPH   10 mg at 11/22/24 9163   QUEtiapine  (SEROQUEL ) tablet 400 mg  400 mg Oral QHS Jadapalle, Sree, MD   400 mg at 11/21/24 2114   traZODone  (DESYREL ) tablet 50 mg  50 mg Oral QHS PRN Jadapalle, Sree, MD   50 mg at 11/21/24 2115   PTA Medications: Medications Prior to Admission  Medication Sig Dispense Refill Last Dose/Taking   busPIRone  (BUSPAR ) 10 MG tablet Take 10 mg by mouth 3 (three) times daily.   Past Week   clomiPRAMINE (ANAFRANIL) 50 MG capsule Take 100 mg by mouth at bedtime.   Past Week   propranolol  (INDERAL ) 20 MG tablet Take 20 mg by mouth 2 (two) times daily as needed.   Past Week   QUEtiapine  (SEROQUEL ) 200 MG tablet Take 200 mg by mouth 2 (two) times daily.   Past Week   Vilazodone HCl (VIIBRYD) 40 MG TABS Take 40 mg by  mouth daily.   Past Month   VRAYLAR  6 MG CAPS Take 1 capsule by mouth daily.   Past Week   VYVANSE 40 MG capsule Take 40 mg by mouth every morning.   Past Month   buPROPion  (WELLBUTRIN  XL) 150 MG 24 hr tablet Take 150 mg by mouth every morning. (Patient not taking: Reported on 11/21/2024)   Not Taking   buPROPion  (WELLBUTRIN  XL) 300 MG 24 hr tablet Take 300 mg by mouth daily. (Patient not taking: Reported on 04/26/2024)   Not Taking   busPIRone  (BUSPAR ) 15 MG tablet Take 15 mg by mouth 3 (three) times daily. (Patient not taking: Reported on 11/21/2024)   Not Taking   cariprazine  (VRAYLAR ) 1.5 MG capsule Take 1.5 mg by mouth daily. (Patient not taking: Reported on 04/26/2024)   Not Taking   cloNIDine HCl (KAPVAY) 0.1 MG TB12 ER tablet Take 0.1 mg by mouth 2 (two) times daily. (Patient not taking: Reported on 11/21/2024)   Not Taking   lamoTRIgine  (LAMICTAL ) 150 MG tablet Take 300 mg by mouth daily. (Patient not taking: Reported on 04/26/2024)    Not Taking   lamoTRIgine  (LAMICTAL ) 200 MG tablet Take 200 mg by mouth daily. (Patient not taking: Reported on 11/21/2024)   Not Taking   propranolol  (INDERAL ) 10 MG tablet Take 10 mg by mouth 3 (three) times daily. (Patient not taking: Reported on 04/26/2024)   Not Taking   QUEtiapine  (SEROQUEL ) 300 MG tablet Take 300 mg by mouth at bedtime. (Patient not taking: Reported on 11/21/2024)   Not Taking   QUEtiapine  (SEROQUEL ) 400 MG tablet Take 400 mg by mouth at bedtime. (Patient not taking: Reported on 11/21/2024)   Not Taking    Patient Stressors: Educational concerns   Financial difficulties   Marital or family conflict   Traumatic event    Patient Strengths: Average or above average intelligence  Communication skills  Work skills   Treatment Modalities: Medication Management, Group therapy, Case management,  1 to 1 session with clinician, Psychoeducation, Recreational therapy.   Physician Treatment Plan for Primary Diagnosis: MDD (major depressive disorder), recurrent, severe, with psychosis (HCC) Long Term Goal(s): Improvement in symptoms so as ready for discharge   Short Term Goals: Ability to identify changes in lifestyle to reduce recurrence of condition will improve Ability to verbalize feelings will improve Ability to disclose and discuss suicidal ideas Ability to demonstrate self-control will improve Ability to identify and develop effective coping behaviors will improve  Medication Management: Evaluate patient's response, side effects, and tolerance of medication regimen.  Therapeutic Interventions: 1 to 1 sessions, Unit Group sessions and Medication administration.  Evaluation of Outcomes: Not Met  Physician Treatment Plan for Secondary Diagnosis: Principal Problem:   MDD (major depressive disorder), recurrent, severe, with psychosis (HCC)  Long Term Goal(s): Improvement in symptoms so as ready for discharge   Short Term Goals: Ability to identify changes in lifestyle  to reduce recurrence of condition will improve Ability to verbalize feelings will improve Ability to disclose and discuss suicidal ideas Ability to demonstrate self-control will improve Ability to identify and develop effective coping behaviors will improve     Medication Management: Evaluate patient's response, side effects, and tolerance of medication regimen.  Therapeutic Interventions: 1 to 1 sessions, Unit Group sessions and Medication administration.  Evaluation of Outcomes: Not Met   RN Treatment Plan for Primary Diagnosis: MDD (major depressive disorder), recurrent, severe, with psychosis (HCC) Long Term Goal(s): Knowledge of disease and therapeutic regimen to maintain health will  improve  Short Term Goals: Ability to demonstrate self-control, Ability to participate in decision making will improve, Ability to verbalize feelings will improve, Ability to disclose and discuss suicidal ideas, Ability to identify and develop effective coping behaviors will improve, and Compliance with prescribed medications will improve  Medication Management: RN will administer medications as ordered by provider, will assess and evaluate patient's response and provide education to patient for prescribed medication. RN will report any adverse and/or side effects to prescribing provider.  Therapeutic Interventions: 1 on 1 counseling sessions, Psychoeducation, Medication administration, Evaluate responses to treatment, Monitor vital signs and CBGs as ordered, Perform/monitor CIWA, COWS, AIMS and Fall Risk screenings as ordered, Perform wound care treatments as ordered.  Evaluation of Outcomes: Not Met   LCSW Treatment Plan for Primary Diagnosis: MDD (major depressive disorder), recurrent, severe, with psychosis (HCC) Long Term Goal(s): Safe transition to appropriate next level of care at discharge, Engage patient in therapeutic group addressing interpersonal concerns.  Short Term Goals: Engage patient in  aftercare planning with referrals and resources, Increase social support, Increase ability to appropriately verbalize feelings, Increase emotional regulation, Facilitate acceptance of mental health diagnosis and concerns, Facilitate patient progression through stages of change regarding substance use diagnoses and concerns, Identify triggers associated with mental health/substance abuse issues, and Increase skills for wellness and recovery  Therapeutic Interventions: Assess for all discharge needs, 1 to 1 time with Social worker, Explore available resources and support systems, Assess for adequacy in community support network, Educate family and significant other(s) on suicide prevention, Complete Psychosocial Assessment, Interpersonal group therapy.  Evaluation of Outcomes: Not Met   Progress in Treatment: Attending groups: Yes. Participating in groups: Yes. Taking medication as prescribed: Yes. Toleration medication: Yes. Family/Significant other contact made: No, will contact:  once permission has been granted Patient understands diagnosis: Yes. Discussing patient identified problems/goals with staff: Yes. Medical problems stabilized or resolved: Yes. Denies suicidal/homicidal ideation: Yes. Issues/concerns per patient self-inventory: No. Other: none  New problem(s) identified: No, Describe:  none  New Short Term/Long Term Goal(s): detox, elimination of symptoms of psychosis, medication management for mood stabilization; elimination of SI thoughts; development of comprehensive mental wellness/sobriety plan.   Patient Goals: find somewhere to go because I am facing homelessness right now   Discharge Plan or Barriers: CSW to assist in the development of appropriate discharge plans.    Reason for Continuation of Hospitalization: Anxiety Depression Medication stabilization Suicidal ideation  Estimated Length of Stay:  1-7 days  Last 3 [place] Suicide Severity Risk  Score: Flowsheet Row Admission (Current) from 11/20/2024 in [facility]ICINE Most recent reading at 11/20/2024  4:32 PM ED from 11/20/2024 in [facility] Emergency Department at [facility] Most recent reading at 11/19/2024 11:25 PM ED from 06/18/2023 in [facility] Department at [facility] Most recent reading at 06/18/2023 10:30 PM  C-SSRS RISK CATEGORY No Risk No Risk No Risk    Last PHQ 2/9 Scores:     No data to display          Scribe for Treatment Team: Sherryle JINNY Margo, LCSW 11/22/2024 11:17 AM

## 2024-11-22 NOTE — BHH Counselor (Signed)
 Patient requested that mother be contacted to bring personal belongings.   CSW reached out to mother on patient's behalf.   Mother agreed to bring personal belongings this evening 11/22/24.   This has been communicated to patient.   Megan Saunders, MSW, LCSWA 11/22/2024 4:37 PM

## 2024-11-22 NOTE — BHH Counselor (Signed)
 CSW was unable to establish successful contact after multiple attempts. A HIPAA-compliant voicemail was left. The patient was informed of the CSW's contact attempts. CSW will make additional attempts at the team's request or if any safety concerns arise and will attempt contact again on the day of discharge.   Ravenna Legore, MSW, LCSWA 11/22/2024 8:37 AM

## 2024-11-22 NOTE — Group Note (Signed)
 Date:  11/22/2024 Time:  8:51 PM  Group Topic/Focus:  Healthy Communication:   The focus of this group is to discuss communication, barriers to communication, as well as healthy ways to communicate with others.    Participation Level:  Active  Participation Quality:  Appropriate  Affect:  Appropriate  Cognitive:  Appropriate  Insight: Appropriate  Engagement in Group:  Engaged  Modes of Intervention:  Education  Additional Comments:    Sunita Demond L 11/22/2024, 8:51 PM

## 2024-11-22 NOTE — Group Note (Signed)
 Date:  11/22/2024 Time:  10:15 AM  Group Topic/Focus:  Building Self Esteem:   The Focus of this group is helping patients become aware of the effects of self-esteem on their lives, the things they and others do that enhance or undermine their self-esteem, seeing the relationship between their level of self-esteem and the choices they make and learning ways to enhance self-esteem.    Participation Level:  Active  Participation Quality:  Appropriate  Affect:  Appropriate  Cognitive:  Appropriate  Insight: Appropriate  Engagement in Group:  Engaged  Modes of Intervention:  Activity  Additional Comments:    Megan Saunders 11/22/2024, 10:15 AM

## 2024-11-22 NOTE — Progress Notes (Addendum)
   11/22/24 1300  Spiritual Encounters  Type of Visit Initial  Care provided to: Patient  Conversation partners present during encounter Nurse;Social worker/Care management/TOC  Referral source Clinical staff  Reason for visit Routine spiritual support  OnCall Visit No  Spiritual Framework  Presenting Themes Meaning/purpose/sources of inspiration;Goals in life/care;Values and beliefs;Coping tools;Impactful experiences and emotions;Courage hope and growth;Rituals and practive;Community and relationships  Community/Connection Other (comment) (Creativity through art, cats and a special cousin.)  Strengths  (Self-awareness)  Needs/Challenges/Barriers  (Familial issues)  Patient Stress Factors Loss of control;Health changes;Family relationships;Financial concerns;Loss;Exhausted  Goals  Self/Personal Goals  (Housing, betterment and finances.)  Clinical Care Goals  (Continue to take meds.)  Additional Comment(s)  (Patient would like to live in a group home.)  Interventions  Spiritual Care Interventions Made Compassionate presence;Reflective listening;Normalization of emotions;Decision-making support/facilitation;Narrative/life review;Explored values/beliefs/practices/strengths;Encouragement  Intervention Outcomes  Outcomes Connection to values and goals of care;Other (comment) (Patient received resources from staff.)  Spiritual Care Plan  Spiritual Care Issues Still Outstanding No further spiritual care needs at this time (see row info)  Recommendations for Clinical Staff  (Patient wants staff to call mother and ask for belongings.)   Chaplain visited patient per staff suggestion to check on emotions and decisions that were made regarding her care and consulted with relevant staff.  Chaplain assessed that patient has a lot of concern related to family support, rejection, grief and loss.  Chaplain encouraged patient and offered her a notebook so she can continue doing her art, which patient  shared she finds therapeutic.  Rev. Rana M. Nicholaus, M.Div. Chaplain Resident [facility]

## 2024-11-22 NOTE — [facility] Group Notes (Signed)
 Spirituality Group   Description: Participant directed exploration of values, beliefs and meaning   **Focus on Gratitude: Invite reflection on sources of gratitude (external/internal); goal to invite internal gratitude to foster 1) reconnection with life-giving activities 2) self-compassion.     Following a brief framework of chaplain's role and ground rules of group behavior, participants are invited to share concerns or questions that engage spiritual life. Emphasis placed on common themes and shared experiences and ways to make meaning and clarify living into one's values.   Theory/Process/Goal: Utilize the theoretical framework of group therapy established by Celena Kite, Relational Cultural Theory and Rogerian approaches to facilitate relational empathy and use of the "here and now" to foster reflection, self-awareness, and sharing.   Observations: Megan Saunders was present for group but was reserved, did not engage.  Anntoinette Haefele L. Delores HERO.Div
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
## 2024-11-22 NOTE — B[facility] Counselor (Addendum)
 CSW touched base with [facility] at [phone] to follow up on referral.   CSW received no answer. HIPAA  compliant voicemail left.   CSW to continue to assess.   Cecillia Menees, MSW, LCSWA 11/22/2024 4:35 PM

## 2024-11-22 NOTE — Progress Notes (Signed)
   11/22/24 0221  Psych Admission Type (Psych Patients Only)  Admission Status Involuntary  Psychosocial Assessment  Patient Complaints Anxiety;Depression  Eye Contact Brief  Facial Expression Anxious  Affect Anxious  Speech Logical/coherent  Interaction Minimal  Motor Activity Other (Comment) (WNL)  Appearance/Hygiene Unremarkable  Behavior Characteristics Cooperative  Mood Anxious  Thought Process  Coherency WDL  Content WDL  Delusions None reported or observed  Perception WDL  Hallucination None reported or observed  Judgment Impaired  Confusion None  Danger to Self  Current suicidal ideation? Denies  Danger to Others  Danger to Others None reported or observed

## 2024-11-22 NOTE — BHH Counselor (Signed)
 CSW engaged in safe discharge planning with patient.  Patient reported I don't want to go to either of them. I don't want anything to do with either of them when speaking of her parents.   Patient requested group home and shelter list.   Ptient reviewed list and conducted phone screening with [facility] in [place], [place].   CSW touched base with family care home and spoke with [facility] who requested patient's FL2 and requested info be sent. CSW sent FL2 and requested info.    CSW to continue to assess.   Anibal Quinby, MSW, LCSWA 11/22/2024 1:57 PM

## 2024-11-22 NOTE — Progress Notes (Signed)
   11/22/24 0836  Psych Admission Type (Psych Patients Only)  Admission Status Involuntary  Psychosocial Assessment  Patient Complaints Anxiety;Depression  Eye Contact Fair  Facial Expression Flat  Affect Flat  Speech Logical/coherent  Interaction Minimal  Motor Activity Other (Comment) (WDL)  Appearance/Hygiene Unremarkable  Behavior Characteristics Cooperative  Mood Anxious  Thought Process  Coherency WDL  Content WDL  Delusions None reported or observed  Perception WDL  Hallucination None reported or observed  Judgment Impaired  Confusion None  Danger to Self  Current suicidal ideation? Denies  Agreement Not to Harm Self Yes  Description of Agreement Verbal  Danger to Others  Danger to Others None reported or observed

## 2024-11-22 NOTE — BHH Suicide Risk Assessment (Signed)
 BHH INPATIENT:  Family/Significant Other Suicide Prevention Education  Suicide Prevention Education:  Contact Attempts: [name], [phone], Father, has been identified by the patient as the family member/significant other with whom the patient will be residing, and identified as the person(s) who will aid the patient in the event of a mental health crisis.  With written consent from the patient, two attempts were made to provide suicide prevention education, prior to and/or following the patient's discharge.  We were unsuccessful in providing suicide prevention education.  A suicide education pamphlet was given to the patient to share with family/significant other.   Date and time of first attempt:11/21/24 at 3:00 PM   Date and time of second attempt: 11/22/24 at 8:36 AM  CSW was unable to establish successful contact after multiple attempts. A HIPAA-compliant voicemail was left. The patient was informed of the CSW's contact attempts. CSW will make additional attempts at the team's request or if any safety concerns arise and will attempt contact again on the day of discharge.   Mckala Pantaleon, MSW, LCSWA 11/22/2024 8:36 AM

## 2024-11-22 NOTE — Plan of Care (Signed)
   Problem: Education: Goal: Emotional status will improve Outcome: Progressing Goal: Mental status will improve Outcome: Progressing Goal: Verbalization of understanding the information provided will improve Outcome: Progressing

## 2024-11-22 NOTE — [place] FL2 (Signed)
 [place]  MEDICAID FL2 LEVEL OF CARE FORM     IDENTIFICATION  Patient Name: Megan Saunders Birthdate: [birth year] Sex: female Admission Date (Current Location): 11/20/2024  [place] and [place] Number:  [place] [id number] [facility] Facility and Address:  [facility], [address] [place], [place] [address]      Provider Number: [id number]  Attending Physician Name and Address:  Donnelly Mellow, MD  Relative Name and Phone Number:  [name], [phone], Father    Current Level of Care: Hospital Recommended Level of Care: Family Care Home, Other (Comment) (Group Home) Prior Approval Number:    Date Approved/Denied:   PASRR Number:    Discharge Plan: Other (Comment) (Family Care Home, Group Home)    Current Diagnoses: Patient Active Problem List   Diagnosis Date Noted   MDD (major depressive disorder), recurrent, severe, with psychosis (HCC) 11/20/2024   Intentional overdose (HCC) 04/27/2024   Suicide attempt (HCC) 04/27/2024   Altered mental status 04/27/2024   Hallucinations 04/27/2024   MDD (major depressive disorder), recurrent episode, severe (HCC) 04/24/2018    Orientation RESPIRATION BLADDER Height & Weight     Self, Time, Situation, Place  Normal Continent Weight: 120.2 kg Height:  5' 9 (175.3 cm)  BEHAVIORAL SYMPTOMS/MOOD NEUROLOGICAL BOWEL NUTRITION STATUS   (N/A)  (N/A) Continent Diet (Regular)  AMBULATORY STATUS COMMUNICATION OF NEEDS Skin   Independent Verbally Normal                       Personal Care Assistance Level of Assistance   (N/A)           Functional Limitations Info   (N/A)          SPECIAL CARE FACTORS FREQUENCY  Blood pressure Blood Pressure Frequency: Takes blood pressure medication. Vital check BID.                  Contractures Contractures Info: Not present    Additional Factors Info  Code Status, Allergies, Psychotropic Code Status Info: Full Allergies Info: Dust Mite Extract,  Grass Extracts (Gramineae Pollens) , Mold Extract (Trichophyton), Cat and dog dander, Pollen Extract Psychotropic Info: busPIRone  (BUSPAR ) tablet 15 mg, cariprazine  (VRAYLAR ) capsule 6 mg, lamoTRIgine  (LAMICTAL ) tablet 200 mg, QUEtiapine  (SEROQUEL ) tablet 400 mg         Current Medications (11/22/2024):  This is the current hospital active medication list Current Facility-Administered Medications  Medication Dose Route Frequency Provider Last Rate Last Admin   acetaminophen  (TYLENOL ) tablet 650 mg  650 mg Oral Q6H PRN Donnelly Mellow, MD       alum & mag hydroxide-simeth (MAALOX/MYLANTA) 200-200-20 MG/5ML suspension 30 mL  30 mL Oral Q4H PRN Jadapalle, Sree, MD       busPIRone  (BUSPAR ) tablet 15 mg  15 mg Oral TID Merrill, Kristin A, RPH   15 mg at 11/22/24 1209   cariprazine  (VRAYLAR ) capsule 6 mg  6 mg Oral Daily Jadapalle, Sree, MD   6 mg at 11/22/24 9164   diphenhydrAMINE  (BENADRYL ) capsule 25 mg  25 mg Oral Q6H PRN Jadapalle, Sree, MD       haloperidol  (HALDOL ) tablet 5 mg  5 mg Oral TID PRN Jadapalle, Sree, MD   5 mg at 11/20/24 1601   And   diphenhydrAMINE  (BENADRYL ) capsule 50 mg  50 mg Oral TID PRN Jadapalle, Sree, MD   50 mg at 11/20/24 1601   haloperidol  lactate (HALDOL ) injection 5 mg  5 mg Intramuscular TID PRN Donnelly Mellow, MD  And   diphenhydrAMINE  (BENADRYL ) injection 50 mg  50 mg Intramuscular TID PRN Donnelly Mellow, MD       And   LORazepam  (ATIVAN ) injection 2 mg  2 mg Intramuscular TID PRN Jadapalle, Sree, MD       haloperidol  lactate (HALDOL ) injection 10 mg  10 mg Intramuscular TID PRN Jadapalle, Sree, MD       And   diphenhydrAMINE  (BENADRYL ) injection 50 mg  50 mg Intramuscular TID PRN Jadapalle, Sree, MD       And   LORazepam  (ATIVAN ) injection 2 mg  2 mg Intramuscular TID PRN Jadapalle, Sree, MD       hydrOXYzine  (ATARAX ) tablet 25 mg  25 mg Oral TID PRN Jadapalle, Sree, MD   25 mg at 11/21/24 0857   lamoTRIgine  (LAMICTAL ) tablet 200 mg  200 mg Oral Daily  Jadapalle, Sree, MD   200 mg at 11/22/24 9163   magnesium  hydroxide (MILK OF MAGNESIA) suspension 30 mL  30 mL Oral Daily PRN Jadapalle, Sree, MD       propranolol  (INDERAL ) tablet 10 mg  10 mg Oral TID Merrill, Kristin A, RPH   10 mg at 11/22/24 1209   QUEtiapine  (SEROQUEL ) tablet 400 mg  400 mg Oral QHS Jadapalle, Sree, MD   400 mg at 11/21/24 2114   traZODone  (DESYREL ) tablet 50 mg  50 mg Oral QHS PRN Jadapalle, Sree, MD   50 mg at 11/21/24 2115     Discharge Medications: Please see discharge summary for a list of discharge medications.  Relevant Imaging Results:  Relevant Lab Results:   Additional Information    Alveta CHRISTELLA Kerns, LCSW

## 2024-11-22 NOTE — Plan of Care (Signed)
 Megan Saunders is a 23 y.o. female patient. No diagnosis found. Past Medical History:  Diagnosis Date   Anxiety    Current Facility-Administered Medications  Medication Dose Route Frequency Provider Last Rate Last Admin   acetaminophen  (TYLENOL ) tablet 650 mg  650 mg Oral Q6H PRN Jadapalle, Sree, MD       alum & mag hydroxide-simeth (MAALOX/MYLANTA) 200-200-20 MG/5ML suspension 30 mL  30 mL Oral Q4H PRN Jadapalle, Sree, MD       busPIRone  (BUSPAR ) tablet 15 mg  15 mg Oral TID Merrill, Kristin A, RPH   15 mg at 11/21/24 2156   cariprazine  (VRAYLAR ) capsule 6 mg  6 mg Oral Daily Jadapalle, Sree, MD   6 mg at 11/21/24 1357   diphenhydrAMINE  (BENADRYL ) capsule 25 mg  25 mg Oral Q6H PRN Donnelly Mellow, MD       haloperidol  (HALDOL ) tablet 5 mg  5 mg Oral TID PRN Jadapalle, Sree, MD   5 mg at 11/20/24 1601   And   diphenhydrAMINE  (BENADRYL ) capsule 50 mg  50 mg Oral TID PRN Jadapalle, Sree, MD   50 mg at 11/20/24 1601   haloperidol  lactate (HALDOL ) injection 5 mg  5 mg Intramuscular TID PRN Jadapalle, Sree, MD       And   diphenhydrAMINE  (BENADRYL ) injection 50 mg  50 mg Intramuscular TID PRN Jadapalle, Sree, MD       And   LORazepam  (ATIVAN ) injection 2 mg  2 mg Intramuscular TID PRN Jadapalle, Sree, MD       haloperidol  lactate (HALDOL ) injection 10 mg  10 mg Intramuscular TID PRN Jadapalle, Sree, MD       And   diphenhydrAMINE  (BENADRYL ) injection 50 mg  50 mg Intramuscular TID PRN Jadapalle, Sree, MD       And   LORazepam  (ATIVAN ) injection 2 mg  2 mg Intramuscular TID PRN Jadapalle, Sree, MD       hydrOXYzine  (ATARAX ) tablet 25 mg  25 mg Oral TID PRN Jadapalle, Sree, MD   25 mg at 11/21/24 0857   lamoTRIgine  (LAMICTAL ) tablet 200 mg  200 mg Oral Daily Jadapalle, Sree, MD   200 mg at 11/21/24 1357   magnesium  hydroxide (MILK OF MAGNESIA) suspension 30 mL  30 mL Oral Daily PRN Donnelly Mellow, MD       propranolol  (INDERAL ) tablet 10 mg  10 mg Oral TID Merrill, Kristin A, RPH   10 mg at  11/21/24 2157   QUEtiapine  (SEROQUEL ) tablet 400 mg  400 mg Oral QHS Jadapalle, Sree, MD   400 mg at 11/21/24 2114   traZODone  (DESYREL ) tablet 50 mg  50 mg Oral QHS PRN Jadapalle, Sree, MD   50 mg at 11/21/24 2115   Allergies  Allergen Reactions   Dust Mite Extract    Grass Extracts [Gramineae Pollens]    Mold Extract [Trichophyton]    Other     Cat and dog dander   Pollen Extract    Principal Problem:   MDD (major depressive disorder), recurrent, severe, with psychosis (HCC)  Blood pressure 133/85, pulse (!) 116, temperature 98.4 F (36.9 C), temperature source Oral, resp. rate 20, height 5' 9 (1.753 m), weight 120.2 kg, SpO2 99%.  Subjective Objective Assessment & Plan  Brendalee Matthies B Cherrell Maybee 11/22/2024

## 2024-11-22 NOTE — Group Note (Signed)
 Recreation Therapy Group Note   Group Topic:Coping Skills  Group Date: 11/22/2024 Start Time: 1530 End Time: 1610 Facilitators: Celestia Jeoffrey BRAVO, LRT, CTRS Location: Dayroom  Group Description: Meditation. LRT asks patients their current level of stress/anxiety from 1-10, with 10 being the highest. LRT educated on the benefits of meditation and relaxation techniques, and how it can apply to everyday life post-discharge. LRT and pt's followed along to an audio script of a "guided meditation" video. LRT asked pt their level of stress and anxiety once the prompt was finished. LRT facilitated post-activity processing to gain feedback on session.   Goal Area(s) Addressed:  Patient will practice using relaxation technique. Patient will identify a new coping skill.  Patient will follow multistep directions to reduce anxiety and stress.   Affect/Mood: Appropriate   Participation Level: Active and Engaged   Participation Quality: Independent   Behavior: Calm and Cooperative   Speech/Thought Process: Coherent   Insight: Fair   Judgement: Fair    Modes of Intervention: Activity and Education   Patient Response to Interventions:  Attentive and Receptive   Education Outcome:  Acknowledges education   Clinical Observations/Individualized Feedback: Lourine was active in their participation of session activities and group discussion. Pt interacted well with LRT and peers duration of session.    Plan: Continue to engage patient in RT group sessions 2-3x/week.   Jeoffrey BRAVO Celestia, LRT, CTRS 11/22/2024 5:16 PM

## 2024-11-23 NOTE — Progress Notes (Signed)
 [facility] MD Progress Note  11/23/2024 12:55 PM Megan Saunders  MRN:  [id number]   Subjective:  Chart reviewed, case discussed in multidisciplinary meeting, patient seen during rounds.   12/6: On interview today, patient is noted to be calm and cooperative, alert and oriented.  She reports some improvement in depression, rating depressive symptoms as 6 out of 10.  She rates anxiety as 6 out of 10.  She reports good sleep and appetite.  She denies SI/HI/plan and denies hallucinations.  She is linear, logical, and future oriented.  She is tolerating medication regimen well without adverse effects.  She is interested in going to a shelter or group home upon hospital discharge, she has discussed this with social work team. Patient is noted to have tachycardia.  EKG on 11/20/2024 showed sinus tachycardia.  Patient denies chest pain or discomfort, palpitations, lightheadedness, dyspnea, fatigue.  Will continue to monitor.   Past Psychiatric History: see h&P Family History: History reviewed. No pertinent family history. Social History:  Social History   Substance and Sexual Activity  Alcohol Use Yes   Alcohol/week: 6.0 standard drinks of alcohol   Types: 2 Glasses of wine, 4 Shots of liquor per week   Comment: Last drink last night (12/14/23) Drinks once monthly     Social History   Substance and Sexual Activity  Drug Use Yes   Frequency: 1.0 times per week   Types: Marijuana    Social History   Socioeconomic History   Marital status: Single    Spouse name: Not on file   Number of children: Not on file   Years of education: Not on file   Highest education level: Not on file  Occupational History   Not on file  Tobacco Use   Smoking status: Every Day    Types: Cigars    Start date: 11/18/2021   Smokeless tobacco: Never  Vaping Use   Vaping status: Never Used  Substance and Sexual Activity   Alcohol use: Yes    Alcohol/week: 6.0 standard drinks of alcohol    Types: 2 Glasses of wine,  4 Shots of liquor per week    Comment: Last drink last night (12/14/23) Drinks once monthly   Drug use: Yes    Frequency: 1.0 times per week    Types: Marijuana   Sexual activity: Yes    Partners: Male, Female    Birth control/protection: Condom, OCP    Comment: Condom use sometimes. Last sex today (12/15/23) w/o condom today.  Other Topics Concern   Not on file  Social History Narrative   ** Merged History Encounter **       Social Drivers of Health   Financial Resource Strain: High Risk (01/25/2023)   Received from [facility]   Overall Financial Resource Strain (CARDIA)    Difficulty of Paying Living Expenses: Very hard  Food Insecurity: Food Insecurity Present (11/20/2024)   Hunger Vital Sign    Worried About Running Out of Food in the Last Year: Sometimes true    Ran Out of Food in the Last Year: Never true  Transportation Needs: Unmet Transportation Needs (11/20/2024)   PRAPARE - Administrator, Civil Service (Medical): Yes    Lack of Transportation (Non-Medical): No  Physical Activity: Sufficiently Active (04/12/2022)   Received from [facility]   Exercise Vital Sign    On average, how many days per week do you engage in moderate to strenuous exercise (like a brisk walk)?:  7 days    On average, how many minutes do you engage in exercise at this level?: 60 min  Stress: Stress Concern Present (04/12/2022)   Received from [facility] Occupational [facility] - Occupational Stress Questionnaire    Feeling of Stress : Very much  Social Connections: Socially Isolated (04/12/2022)   Received from [facility]   Social Connection and Isolation Panel    In a typical week, how many times do you talk on the phone with family, friends, or neighbors?: Once a week    How often do you get together with friends or relatives?: Never    How often do you attend church or religious services?:  Never    Do you belong to any clubs or organizations such as church groups, unions, fraternal or athletic groups, or school groups?: No    How often do you attend meetings of the clubs or organizations you belong to?: Never    Are you married, widowed, divorced, separated, never married, or living with a partner?: Living with partner   Past Medical History:  Past Medical History:  Diagnosis Date   Anxiety    History reviewed. No pertinent surgical history.  Current Medications: Current Facility-Administered Medications  Medication Dose Route Frequency Provider Last Rate Last Admin   acetaminophen  (TYLENOL ) tablet 650 mg  650 mg Oral Q6H PRN Donnelly Mellow, MD       alum & mag hydroxide-simeth (MAALOX/MYLANTA) 200-200-20 MG/5ML suspension 30 mL  30 mL Oral Q4H PRN Jadapalle, Sree, MD       busPIRone  (BUSPAR ) tablet 15 mg  15 mg Oral TID Merrill, Kristin A, RPH   15 mg at 11/23/24 1224   cariprazine  (VRAYLAR ) capsule 6 mg  6 mg Oral Daily Jadapalle, Sree, MD   6 mg at 11/23/24 9171   diphenhydrAMINE  (BENADRYL ) capsule 25 mg  25 mg Oral Q6H PRN Jadapalle, Sree, MD       haloperidol  (HALDOL ) tablet 5 mg  5 mg Oral TID PRN Jadapalle, Sree, MD   5 mg at 11/20/24 1601   And   diphenhydrAMINE  (BENADRYL ) capsule 50 mg  50 mg Oral TID PRN Jadapalle, Sree, MD   50 mg at 11/20/24 1601   haloperidol  lactate (HALDOL ) injection 5 mg  5 mg Intramuscular TID PRN Jadapalle, Sree, MD       And   diphenhydrAMINE  (BENADRYL ) injection 50 mg  50 mg Intramuscular TID PRN Jadapalle, Sree, MD       And   LORazepam  (ATIVAN ) injection 2 mg  2 mg Intramuscular TID PRN Jadapalle, Sree, MD       haloperidol  lactate (HALDOL ) injection 10 mg  10 mg Intramuscular TID PRN Jadapalle, Sree, MD       And   diphenhydrAMINE  (BENADRYL ) injection 50 mg  50 mg Intramuscular TID PRN Jadapalle, Sree, MD       And   LORazepam  (ATIVAN ) injection 2 mg  2 mg Intramuscular TID PRN Jadapalle, Sree, MD       hydrOXYzine  (ATARAX )  tablet 25 mg  25 mg Oral TID PRN Jadapalle, Sree, MD   25 mg at 11/21/24 0857   lamoTRIgine  (LAMICTAL ) tablet 200 mg  200 mg Oral Daily Jadapalle, Sree, MD   200 mg at 11/23/24 0827   magnesium  hydroxide (MILK OF MAGNESIA) suspension 30 mL  30 mL Oral Daily PRN Jadapalle, Sree, MD       propranolol  (INDERAL ) tablet 10 mg  10 mg  Oral TID Merrill, Kristin A, RPH   10 mg at 11/23/24 1224   QUEtiapine  (SEROQUEL ) tablet 400 mg  400 mg Oral QHS Jadapalle, Sree, MD   400 mg at 11/22/24 2112   traZODone  (DESYREL ) tablet 50 mg  50 mg Oral QHS PRN Jadapalle, Sree, MD   50 mg at 11/22/24 2112    Lab Results: No results found for this or any previous visit (from the past 48 hours).  Blood Alcohol level:  Lab Results  Component Value Date   [facility] <15 11/19/2024   ETH <15 04/26/2024    Metabolic Disorder Labs: Lab Results  Component Value Date   HGBA1C 5.7 (H) 11/21/2024   MPG 117 11/21/2024   MPG 105.41 04/26/2018   No results found for: PROLACTIN Lab Results  Component Value Date   CHOL 174 11/21/2024   TRIG 152 (H) 11/21/2024   HDL 66 11/21/2024   CHOLHDL 2.7 11/21/2024   VLDL 30 11/21/2024   LDLCALC 78 11/21/2024   LDLCALC 91 04/26/2018    Physical Findings: AIMS:  , ,  ,  ,    CIWA:    COWS:      Psychiatric Specialty Exam:  Presentation  General Appearance:  Appropriate for Environment  Eye Contact: Fair  Speech: Clear and Coherent  Speech Volume: Normal    Mood and Affect  Mood: Depressed  Affect: Appropriate   Thought Process  Thought Processes: Coherent  Orientation:Full (Time, Place and Person)  Thought Content:Logical  Hallucinations: None Ideas of Reference:None  Suicidal Thoughts: Denies Homicidal Thoughts: Denies  Sensorium  Memory: Immediate Fair; Recent Fair  Judgment: Poor  Insight: Fair  Chartered Certified Accountant: Fair  Attention Span: Fair  Recall: [employer] of  Knowledge: Fair  Language: Fair   Psychomotor Activity  Psychomotor Activity: Normal Musculoskeletal: Strength & Muscle Tone: within normal limits Gait & Station: normal Assets  Assets: Manufacturing Systems Engineer; Desire for Improvement    Physical Exam: Physical Exam ROS Blood pressure 117/71, pulse (!) 113, temperature 98.4 F (36.9 C), temperature source Oral, resp. rate 18, height 5' 9 (1.753 m), weight 120.2 kg, SpO2 99%. Body mass index is 39.13 kg/m.  Diagnosis: Principal Problem:   MDD (major depressive disorder), recurrent, severe, with psychosis (HCC)   PLAN: Safety and Monitoring:  -- Voluntary admission to inpatient psychiatric unit for safety, stabilization and treatment  -- Daily contact with patient to assess and evaluate symptoms and progress in treatment  -- Patient's case to be discussed in multi-disciplinary team meeting  -- Observation Level : q15 minute checks  -- Vital signs:  q12 hours  -- Precautions: suicide, elopement, and assault -- Encouraged patient to participate in unit milieu and in scheduled group therapies  2. Psychiatric Treatment:  Scheduled Medications:  Seroquel  400 mg at bedtime Propranolol  10 mg 3 times daily Vraylar  6 mg once daily Lamictal  200 mg once daily Buspirone  15 mg 3 times daily    -- The risks/benefits/side-effects/alternatives to this medication were discussed in detail with the patient and time was given for questions. The patient consents to medication trial.  3. Medical Issues Being Addressed:  No acute concerns.   4. Discharge Planning:             -- Likely Monday  -- Social work and case management to assist with discharge planning and identification of hospital follow-up needs prior to discharge  -- Estimated LOS: 3-4 days  Camelia LITTIE Lukes, PA-C 11/23/2024, 12:55 PM

## 2024-11-23 NOTE — Group Note (Signed)
 Date:  11/23/2024 Time:  5:19 PM  Group Topic/Focus:  Goals Group:   The focus of this group is to help patients establish daily goals to achieve during treatment and discuss how the patient can incorporate goal setting into their daily lives to aide in recovery.    Participation Level:  Did Not Attend  Participation Quality:    Affect:    Cognitive:    Insight:   Engagement in Group:    Modes of Intervention:    Additional Comments:    Bonnielee LOISE Pepper 11/23/2024, 5:19 PM

## 2024-11-23 NOTE — Progress Notes (Signed)
   11/23/24 1000  Psych Admission Type (Psych Patients Only)  Admission Status Involuntary  Psychosocial Assessment  Patient Complaints Anxiety  Eye Contact Fair  Facial Expression Flat  Affect Appropriate to circumstance  Speech Logical/coherent  Interaction Minimal  Motor Activity Slow  Appearance/Hygiene Unremarkable  Behavior Characteristics Cooperative;Appropriate to situation  Mood Anxious;Pleasant  Thought Process  Coherency WDL  Content WDL  Delusions None reported or observed  Perception WDL  Hallucination None reported or observed  Judgment Impaired  Confusion None  Danger to Self  Current suicidal ideation? Denies  Agreement Not to Harm Self Yes  Description of Agreement verbal   Patient rated her depression 6/10 and anxiety 8/10. Not needed any PRN at this time.

## 2024-11-23 NOTE — Group Note (Signed)
 Date:  11/23/2024 Time:  8:33 PM  Group Topic/Focus:  Managing Feelings:   The focus of this group is to identify what feelings patients have difficulty handling and develop a plan to handle them in a healthier way upon discharge. Wrap-Up Group:   The focus of this group is to help patients review their daily goal of treatment and discuss progress on daily workbooks.    Participation Level:  Active  Participation Quality:  Appropriate and Attentive  Affect:  Appropriate  Cognitive:  Alert, Appropriate, and Oriented  Insight: Appropriate and Good  Engagement in Group:  Engaged  Modes of Intervention:  Activity, Discussion, and Support  Additional Comments:  N/A  Butler LITTIE Gelineau 11/23/2024, 8:33 PM

## 2024-11-23 NOTE — Progress Notes (Signed)
   11/22/24 2000  Psych Admission Type (Psych Patients Only)  Admission Status Involuntary  Psychosocial Assessment  Patient Complaints Anxiety;Depression  Eye Contact Fair  Facial Expression Flat  Affect Flat  Speech Logical/coherent  Interaction Minimal  Motor Activity Slow  Appearance/Hygiene Unremarkable  Behavior Characteristics Cooperative;Appropriate to situation  Mood Anxious  Thought Process  Coherency WDL  Content WDL  Delusions WDL  Perception WDL  Hallucination None reported or observed  Judgment Impaired  Confusion None  Danger to Self  Current suicidal ideation? Denies  Agreement Not to Harm Self Yes  Description of Agreement verbal  Danger to Others  Danger to Others None reported or observed   Patient alert and oriented x 4, affect is flat but brightens upon approach, she denies SI/HI/AVH.

## 2024-11-23 NOTE — Plan of Care (Signed)
  Problem: Education: Goal: Knowledge of [facility] Education information/materials will improve Outcome: Progressing   Problem: Education: Goal: Mental status will improve Outcome: Progressing   

## 2024-11-23 NOTE — Group Note (Signed)
 Date:  11/23/2024 Time:  6:36 PM  Group Topic/Focus:  Wellness Toolbox:   The focus of this group is to discuss various aspects of wellness, balancing those aspects and exploring ways to increase the ability to experience wellness.  Patients will create a wellness toolbox for use upon discharge.    Participation Level:  Active  Participation Quality:  Appropriate  Affect:  Appropriate  Cognitive:  Alert  Insight: Appropriate  Engagement in Group:  Engaged  Modes of Intervention:  Activity  Additional Comments:    Megan Saunders 11/23/2024, 6:36 PM

## 2024-11-24 MED ORDER — QUETIAPINE FUMARATE 400 MG PO TABS
400.0000 mg | ORAL_TABLET | Freq: Every day | ORAL | 0 refills | Status: AC
  Filled 2024-11-24: qty 30, 30d supply, fill #0

## 2024-11-24 MED ORDER — PROPRANOLOL HCL 10 MG PO TABS
10.0000 mg | ORAL_TABLET | Freq: Three times a day (TID) | ORAL | 0 refills | Status: DC
  Filled 2024-11-24: qty 90, 30d supply, fill #0

## 2024-11-24 MED ORDER — BUSPIRONE HCL 15 MG PO TABS
15.0000 mg | ORAL_TABLET | Freq: Three times a day (TID) | ORAL | 0 refills | Status: AC
  Filled 2024-11-24: qty 90, 30d supply, fill #0

## 2024-11-24 MED ORDER — LAMOTRIGINE 200 MG PO TABS
200.0000 mg | ORAL_TABLET | Freq: Every day | ORAL | 0 refills | Status: AC
  Filled 2024-11-24: qty 30, 30d supply, fill #0

## 2024-11-24 MED ORDER — CARIPRAZINE HCL 6 MG PO CAPS
6.0000 mg | ORAL_CAPSULE | Freq: Every day | ORAL | 0 refills | Status: AC
  Filled 2024-11-24: qty 30, 30d supply, fill #0

## 2024-11-24 NOTE — B[facility] Counselor (Addendum)
 LCSWA spoke with patient to confirm discharge plan regarding where she would be staying after discharge.   The patient stated that she would be going to her neighbors house st discharge and provided contact information for LCSWA to confirm.   The LCSWA spoke with [name] [phone]) to confirm the patient would be staying with her and that there would be no weapons in the home. Address: [address] [place] [address]  Megan Saunders

## 2024-11-24 NOTE — Plan of Care (Signed)

## 2024-11-24 NOTE — Plan of Care (Signed)
   Problem: Education: Goal: Emotional status will improve Outcome: Progressing Goal: Mental status will improve Outcome: Progressing

## 2024-11-24 NOTE — Group Note (Signed)
 Date:  11/24/2024 Time:  11:03 PM  Group Topic/Focus:  Wrap-Up Group:   The focus of this group is to help patients review their daily goal of treatment and discuss progress on daily workbooks.    Participation Level:  Active  Participation Quality:  Appropriate  Affect:  Appropriate  Cognitive:  Alert and Appropriate  Insight: Appropriate and Good  Engagement in Group:  Engaged  Modes of Intervention:  Activity  Additional Comments:    Megan Saunders Servant 11/24/2024, 11:03 PM

## 2024-11-24 NOTE — Group Note (Signed)
 LCSW Group Therapy Note  Group Date: 11/24/2024 Start Time: 1510 End Time: 1550   Type of Therapy and Topic:  Group Therapy - Healthy vs Unhealthy Coping Skills  Participation Level:  Active   Description of Group The focus of this group was to determine what unhealthy coping techniques typically are used by group members and what healthy coping techniques would be helpful in coping with various problems. Patients were guided in becoming aware of the differences between healthy and unhealthy coping techniques. Patients were asked to identify 2-3 healthy coping skills they would like to learn to use more effectively.  Therapeutic Goals Patients learned that coping is what human beings do all day long to deal with various situations in their lives Patients defined and discussed healthy vs unhealthy coping techniques Patients identified their preferred coping techniques and identified whether these were healthy or unhealthy Patients determined 2-3 healthy coping skills they would like to become more familiar with and use more often. Patients provided support and ideas to each other   Summary of Patient Progress:  Patient attended group. Patient proved open to input from peers and feedback from CSW. Patient demonstrated  insight into the subject matter, was respectful of peers, and participated throughout the entire session.   Therapeutic Modalities Cognitive Behavioral Therapy  Barbar Brede S Tyrees Chopin, LCSWA 11/24/2024  4:46 PM

## 2024-11-24 NOTE — Progress Notes (Signed)
   11/24/24 2100  Psych Admission Type (Psych Patients Only)  Admission Status Involuntary  Psychosocial Assessment  Patient Complaints None  Eye Contact Fair  Facial Expression Animated  Affect Blunted  Speech Logical/coherent  Interaction Assertive  Motor Activity Other (Comment) (appropriate for developmental age)  Appearance/Hygiene Unremarkable  Behavior Characteristics Cooperative  Mood Pleasant  Thought Process  Coherency WDL  Content WDL  Delusions None reported or observed  Perception WDL  Hallucination None reported or observed  Judgment Poor  Confusion None  Danger to Self  Current suicidal ideation? Denies  Self-Injurious Behavior No self-injurious ideation or behavior indicators observed or expressed   Danger to Others  Danger to Others None reported or observed

## 2024-11-24 NOTE — Progress Notes (Signed)
 [facility] MD Progress Note  11/24/2024 12:48 PM Megan Saunders  MRN:  [id number]   Subjective:  Chart reviewed, case discussed in multidisciplinary meeting, patient seen during rounds.   12/7: On interview today, patient is found interacting appropriately in the milieu.  She returns to her room to engage in interview with provider.  She is calm and cooperative, alert and oriented.  She reports good sleep and appetite.  She rates depression as 5 out of 10 and anxiety 7 out of 10 today.  She denies SI/HI/plan and denies hallucinations.  She is able to discuss coping skills, crisis resources, and support system.  She reports she wrote a letter to her mother as a way to help process her feelings.  She states she also plans to write a letter to her father.  She is tolerating medication regimen well without adverse effects.  She is linear, logical, and future oriented.  She denies access to guns or other lethal means.  She states upon hospital discharge, she plans to stay at her neighbor's house, with whom she has a good relationship with.  She states she has also been in contact with group homes. Patient continues to deny chest pain or discomfort, palpitations, lightheadedness, dyspnea, or fatigue.  Hydration is encouraged.  She is advised to follow-up with PCP after hospital discharge, patient verbalizes understanding and agreement.  12/6: On interview today, patient is noted to be calm and cooperative, alert and oriented.  She reports some improvement in depression, rating depressive symptoms as 6 out of 10.  She rates anxiety as 6 out of 10.  She reports good sleep and appetite.  She denies SI/HI/plan and denies hallucinations.  She is linear, logical, and future oriented.  She is tolerating medication regimen well without adverse effects.  She is interested in going to a shelter or group home upon hospital discharge, she has discussed this with social work team. Patient is noted to have tachycardia.  EKG on  11/20/2024 showed sinus tachycardia.  Patient denies chest pain or discomfort, palpitations, lightheadedness, dyspnea, fatigue.  Will continue to monitor.   Past Psychiatric History: see h&P Family History: History reviewed. No pertinent family history. Social History:  Social History   Substance and Sexual Activity  Alcohol Use Yes   Alcohol/week: 6.0 standard drinks of alcohol   Types: 2 Glasses of wine, 4 Shots of liquor per week   Comment: Last drink last night (12/14/23) Drinks once monthly     Social History   Substance and Sexual Activity  Drug Use Yes   Frequency: 1.0 times per week   Types: Marijuana    Social History   Socioeconomic History   Marital status: Single    Spouse name: Not on file   Number of children: Not on file   Years of education: Not on file   Highest education level: Not on file  Occupational History   Not on file  Tobacco Use   Smoking status: Every Day    Types: Cigars    Start date: 11/18/2021   Smokeless tobacco: Never  Vaping Use   Vaping status: Never Used  Substance and Sexual Activity   Alcohol use: Yes    Alcohol/week: 6.0 standard drinks of alcohol    Types: 2 Glasses of wine, 4 Shots of liquor per week    Comment: Last drink last night (12/14/23) Drinks once monthly   Drug use: Yes    Frequency: 1.0 times per week    Types: Marijuana  Sexual activity: Yes    Partners: Male, Female    Birth control/protection: Condom, OCP    Comment: Condom use sometimes. Last sex today (12/15/23) w/o condom today.  Other Topics Concern   Not on file  Social History Narrative   ** Merged History Encounter **       Social Drivers of Health   Financial Resource Strain: High Risk (01/25/2023)   Received from [facility]   Overall Financial Resource Strain (CARDIA)    Difficulty of Paying Living Expenses: Very hard  Food Insecurity: Food Insecurity Present (11/20/2024)   Hunger Vital Sign    Worried About Running Out of  Food in the Last Year: Sometimes true    Ran Out of Food in the Last Year: Never true  Transportation Needs: Unmet Transportation Needs (11/20/2024)   PRAPARE - Administrator, Civil Service (Medical): Yes    Lack of Transportation (Non-Medical): No  Physical Activity: Sufficiently Active (04/12/2022)   Received from [facility]   Exercise Vital Sign    On average, how many days per week do you engage in moderate to strenuous exercise (like a brisk walk)?: 7 days    On average, how many minutes do you engage in exercise at this level?: 60 min  Stress: Stress Concern Present (04/12/2022)   Received from [facility] of Occupational Health - Occupational Stress Questionnaire    Feeling of Stress : Very much  Social Connections: Socially Isolated (04/12/2022)   Received from [facility]   Social Connection and Isolation Panel    In a typical week, how many times do you talk on the phone with family, friends, or neighbors?: Once a week    How often do you get together with friends or relatives?: Never    How often do you attend church or religious services?: Never    Do you belong to any clubs or organizations such as church groups, unions, fraternal or athletic groups, or school groups?: No    How often do you attend meetings of the clubs or organizations you belong to?: Never    Are you married, widowed, divorced, separated, never married, or living with a partner?: Living with partner   Past Medical History:  Past Medical History:  Diagnosis Date   Anxiety    History reviewed. No pertinent surgical history.  Current Medications: Current Facility-Administered Medications  Medication Dose Route Frequency Provider Last Rate Last Admin   acetaminophen  (TYLENOL ) tablet 650 mg  650 mg Oral Q6H PRN Donnelly Mellow, MD       alum & mag hydroxide-simeth (MAALOX/MYLANTA) 200-200-20 MG/5ML suspension 30 mL  30 mL  Oral Q4H PRN Jadapalle, Sree, MD       busPIRone  (BUSPAR ) tablet 15 mg  15 mg Oral TID Merrill, Kristin A, RPH   15 mg at 11/24/24 9163   cariprazine  (VRAYLAR ) capsule 6 mg  6 mg Oral Daily Jadapalle, Sree, MD   6 mg at 11/24/24 9163   diphenhydrAMINE  (BENADRYL ) capsule 25 mg  25 mg Oral Q6H PRN Jadapalle, Sree, MD       haloperidol  (HALDOL ) tablet 5 mg  5 mg Oral TID PRN Jadapalle, Sree, MD   5 mg at 11/20/24 1601   And   diphenhydrAMINE  (BENADRYL ) capsule 50 mg  50 mg Oral TID PRN Jadapalle, Sree, MD   50 mg at 11/20/24 1601   haloperidol  lactate (HALDOL ) injection 5 mg  5  mg Intramuscular TID PRN Jadapalle, Sree, MD       And   diphenhydrAMINE  (BENADRYL ) injection 50 mg  50 mg Intramuscular TID PRN Jadapalle, Sree, MD       And   LORazepam  (ATIVAN ) injection 2 mg  2 mg Intramuscular TID PRN Jadapalle, Sree, MD       haloperidol  lactate (HALDOL ) injection 10 mg  10 mg Intramuscular TID PRN Donnelly Mellow, MD       And   diphenhydrAMINE  (BENADRYL ) injection 50 mg  50 mg Intramuscular TID PRN Jadapalle, Sree, MD       And   LORazepam  (ATIVAN ) injection 2 mg  2 mg Intramuscular TID PRN Jadapalle, Sree, MD       hydrOXYzine  (ATARAX ) tablet 25 mg  25 mg Oral TID PRN Jadapalle, Sree, MD   25 mg at 11/21/24 0857   lamoTRIgine  (LAMICTAL ) tablet 200 mg  200 mg Oral Daily Jadapalle, Sree, MD   200 mg at 11/24/24 9163   magnesium  hydroxide (MILK OF MAGNESIA) suspension 30 mL  30 mL Oral Daily PRN Donnelly Mellow, MD       propranolol  (INDERAL ) tablet 10 mg  10 mg Oral TID Merrill, Kristin A, RPH   10 mg at 11/24/24 9163   QUEtiapine  (SEROQUEL ) tablet 400 mg  400 mg Oral QHS Jadapalle, Sree, MD   400 mg at 11/23/24 2109   traZODone  (DESYREL ) tablet 50 mg  50 mg Oral QHS PRN Jadapalle, Sree, MD   50 mg at 11/23/24 2109    Lab Results: No results found for this or any previous visit (from the past 48 hours).  Blood Alcohol level:  Lab Results  Component Value Date   [facility] <15 11/19/2024   ETH <15  04/26/2024    Metabolic Disorder Labs: Lab Results  Component Value Date   HGBA1C 5.7 (H) 11/21/2024   MPG 117 11/21/2024   MPG 105.41 04/26/2018   No results found for: PROLACTIN Lab Results  Component Value Date   CHOL 174 11/21/2024   TRIG 152 (H) 11/21/2024   HDL 66 11/21/2024   CHOLHDL 2.7 11/21/2024   VLDL 30 11/21/2024   LDLCALC 78 11/21/2024   LDLCALC 91 04/26/2018    Physical Findings: AIMS:  , ,  ,  ,    CIWA:    COWS:      Psychiatric Specialty Exam:  Presentation  General Appearance:  Appropriate for Environment  Eye Contact: Fair  Speech: Clear and Coherent  Speech Volume: Normal    Mood and Affect  Mood: Depressed  Affect: Appropriate   Thought Process  Thought Processes: Coherent  Orientation:Full (Time, Place and Person)  Thought Content:Logical  Hallucinations: None Ideas of Reference:None  Suicidal Thoughts: Denies Homicidal Thoughts: Denies  Sensorium  Memory: Immediate Fair; Recent Fair  Judgment: Fair  Insight: Fair  Art Therapist  Concentration: Fair  Attention Span: Fair  Recall: [employer] of Knowledge: Fair  Language: Fair   Psychomotor Activity  Psychomotor Activity: Normal Musculoskeletal: Strength & Muscle Tone: within normal limits Gait & Station: normal Assets  Assets: Manufacturing Systems Engineer; Desire for Improvement    Physical Exam: Physical Exam ROS Blood pressure 127/85, pulse (!) 116, temperature 98.7 F (37.1 C), temperature source Oral, resp. rate 18, height 5' 9 (1.753 m), weight 120.2 kg, SpO2 100%. Body mass index is 39.13 kg/m.  Diagnosis: Principal Problem:   MDD (major depressive disorder), recurrent, severe, with psychosis (HCC)   PLAN: Safety and Monitoring:  -- Voluntary admission  to inpatient psychiatric unit for safety, stabilization and treatment  -- Daily contact with patient to assess and evaluate symptoms and progress in treatment  --  Patient's case to be discussed in multi-disciplinary team meeting  -- Observation Level : q15 minute checks  -- Vital signs:  q12 hours  -- Precautions: suicide, elopement, and assault -- Encouraged patient to participate in unit milieu and in scheduled group therapies  2. Psychiatric Treatment:  Scheduled Medications:  Seroquel  400 mg at bedtime Propranolol  10 mg 3 times daily Vraylar  6 mg once daily Lamictal  200 mg once daily Buspirone  15 mg 3 times daily    -- The risks/benefits/side-effects/alternatives to this medication were discussed in detail with the patient and time was given for questions. The patient consents to medication trial.  3. Medical Issues Being Addressed:  No acute concerns.   4. Discharge Planning:             -- Likely Monday  -- Social work and case management to assist with discharge planning and identification of hospital follow-up needs prior to discharge  -- Estimated LOS: 3-4 days  Camelia LITTIE Lukes, PA-C 11/24/2024, 12:48 PM

## 2024-11-24 NOTE — Progress Notes (Signed)
 [facility] MD Progress Note   Megan Saunders  MRN:  [id number]   Subjective:  Chart reviewed, case discussed in multidisciplinary meeting, patient seen during rounds.   [name] who currently is homeless she reportedly had lot of conflicts with her family and because of which she is here and she became suicidal during that conflicts she is now looking into housing she is still somewhat labile but overall is doing well.  No symptoms of mania hypomania psychosis reported at this time.   Past Psychiatric History: see h&P Family History: History reviewed. No pertinent family history. Social History:  Social History   Substance and Sexual Activity  Alcohol Use Yes   Alcohol/week: 6.0 standard drinks of alcohol   Types: 2 Glasses of wine, 4 Shots of liquor per week   Comment: Last drink last night (12/14/23) Drinks once monthly     Social History   Substance and Sexual Activity  Drug Use Yes   Frequency: 1.0 times per week   Types: Marijuana    Social History   Socioeconomic History   Marital status: Single    Spouse name: Not on file   Number of children: Not on file   Years of education: Not on file   Highest education level: Not on file  Occupational History   Not on file  Tobacco Use   Smoking status: Every Day    Types: Cigars    Start date: 11/18/2021   Smokeless tobacco: Never  Vaping Use   Vaping status: Never Used  Substance and Sexual Activity   Alcohol use: Yes    Alcohol/week: 6.0 standard drinks of alcohol    Types: 2 Glasses of wine, 4 Shots of liquor per week    Comment: Last drink last night (12/14/23) Drinks once monthly   Drug use: Yes    Frequency: 1.0 times per week    Types: Marijuana   Sexual activity: Yes    Partners: Male, Female    Birth control/protection: Condom, OCP    Comment: Condom use sometimes. Last sex today (12/15/23) w/o condom today.  Other Topics Concern   Not on file  Social History Narrative   ** Merged  History Encounter **       Social Drivers of Health   Financial Resource Strain: High Risk (01/25/2023)   Received from [facility]   Overall Financial Resource Strain (CARDIA)    Difficulty of Paying Living Expenses: Very hard  Food Insecurity: Food Insecurity Present (11/20/2024)   Hunger Vital Sign    Worried About Running Out of Food in the Last Year: Sometimes true    Ran Out of Food in the Last Year: Never true  Transportation Needs: Unmet Transportation Needs (11/20/2024)   PRAPARE - Administrator, Civil Service (Medical): Yes    Lack of Transportation (Non-Medical): No  Physical Activity: Sufficiently Active (04/12/2022)   Received from [facility]   Exercise Vital Sign    On average, how many days per week do you engage in moderate to strenuous exercise (like a brisk walk)?: 7 days    On average, how many minutes do you engage in exercise at this level?: 60 min  Stress: Stress Concern Present (04/12/2022)   Received from [facility] of Occupational Health - Occupational Stress Questionnaire    Feeling of Stress : Very much  Social Connections: Socially Isolated (04/12/2022)   Received from [facility]   Social Connection and Isolation Panel    In a typical week, how many times do you talk on the phone with family, friends, or neighbors?: Once a week    How often do you get together with friends or relatives?: Never    How often do you attend church or religious services?: Never    Do you belong to any clubs or organizations such as church groups, unions, fraternal or athletic groups, or school groups?: No    How often do you attend meetings of the clubs or organizations you belong to?: Never    Are you married, widowed, divorced, separated, never married, or living with a partner?: Living with partner   Past Medical History:  Past Medical History:  Diagnosis Date   Anxiety     History reviewed. No pertinent surgical history.  Current Medications: Current Facility-Administered Medications  Medication Dose Route Frequency Provider Last Rate Last Admin   acetaminophen  (TYLENOL ) tablet 650 mg  650 mg Oral Q6H PRN Jadapalle, Sree, MD       alum & mag hydroxide-simeth (MAALOX/MYLANTA) 200-200-20 MG/5ML suspension 30 mL  30 mL Oral Q4H PRN Jadapalle, Sree, MD       busPIRone  (BUSPAR ) tablet 15 mg  15 mg Oral TID Merrill, Kristin A, RPH   15 mg at 11/24/24 0836   cariprazine  (VRAYLAR ) capsule 6 mg  6 mg Oral Daily Jadapalle, Sree, MD   6 mg at 11/24/24 9163   diphenhydrAMINE  (BENADRYL ) capsule 25 mg  25 mg Oral Q6H PRN Donnelly Mellow, MD       haloperidol  (HALDOL ) tablet 5 mg  5 mg Oral TID PRN Jadapalle, Sree, MD   5 mg at 11/20/24 1601   And   diphenhydrAMINE  (BENADRYL ) capsule 50 mg  50 mg Oral TID PRN Jadapalle, Sree, MD   50 mg at 11/20/24 1601   haloperidol  lactate (HALDOL ) injection 5 mg  5 mg Intramuscular TID PRN Jadapalle, Sree, MD       And   diphenhydrAMINE  (BENADRYL ) injection 50 mg  50 mg Intramuscular TID PRN Jadapalle, Sree, MD       And   LORazepam  (ATIVAN ) injection 2 mg  2 mg Intramuscular TID PRN Jadapalle, Sree, MD       haloperidol  lactate (HALDOL ) injection 10 mg  10 mg Intramuscular TID PRN Jadapalle, Sree, MD       And   diphenhydrAMINE  (BENADRYL ) injection 50 mg  50 mg Intramuscular TID PRN Jadapalle, Sree, MD       And   LORazepam  (ATIVAN ) injection 2 mg  2 mg Intramuscular TID PRN Jadapalle, Sree, MD       hydrOXYzine  (ATARAX ) tablet 25 mg  25 mg Oral TID PRN Jadapalle, Sree, MD   25 mg at 11/21/24 0857   lamoTRIgine  (LAMICTAL ) tablet 200 mg  200 mg Oral Daily Jadapalle, Sree, MD   200 mg at 11/24/24 9163   magnesium  hydroxide (MILK OF MAGNESIA) suspension 30 mL  30 mL Oral Daily PRN Donnelly Mellow, MD       propranolol  (INDERAL ) tablet 10 mg  10 mg Oral TID Merrill, Kristin A, RPH   10 mg at 11/24/24 9163   QUEtiapine  (SEROQUEL ) tablet  400 mg  400 mg Oral QHS Jadapalle, Sree, MD   400 mg at 11/23/24 2109   traZODone  (DESYREL ) tablet 50 mg  50 mg Oral QHS PRN Jadapalle, Sree, MD   50 mg at 11/23/24 2109    Lab Results: No results found  for this or any previous visit (from the past 48 hours).  Blood Alcohol level:  Lab Results  Component Value Date   [facility] <15 11/19/2024   ETH <15 04/26/2024    Metabolic Disorder Labs: Lab Results  Component Value Date   HGBA1C 5.7 (H) 11/21/2024   MPG 117 11/21/2024   MPG 105.41 04/26/2018   No results found for: PROLACTIN Lab Results  Component Value Date   CHOL 174 11/21/2024   TRIG 152 (H) 11/21/2024   HDL 66 11/21/2024   CHOLHDL 2.7 11/21/2024   VLDL 30 11/21/2024   LDLCALC 78 11/21/2024   LDLCALC 91 04/26/2018    Physical Findings: AIMS:  , ,  ,  ,    CIWA:    COWS:      Psychiatric Specialty Exam:  Presentation  General Appearance:  Appropriate for Environment  Eye Contact: Fair  Speech: Clear and Coherent  Speech Volume: Normal    Mood and Affect  Mood: Depressed  Affect: Appropriate   Thought Process  Thought Processes: Coherent  Orientation:Full (Time, Place and Person)  Thought Content:Logical  Hallucinations: None Ideas of Reference:None  Suicidal Thoughts: Denies Homicidal Thoughts: Denies  Sensorium  Memory: Immediate Fair; Recent Fair  Judgment: Poor  Insight: Fair  Chartered Certified Accountant: Fair  Attention Span: Fair  Recall: [employer] of Knowledge: Fair  Language: Fair   Psychomotor Activity  Psychomotor Activity: Normal Musculoskeletal: Strength & Muscle Tone: within normal limits Gait & Station: normal Assets  Assets: Manufacturing Systems Engineer; Desire for Improvement    Physical Exam: Physical Exam ROS Blood pressure 127/85, pulse (!) 116, temperature 98.7 F (37.1 C), temperature source Oral, resp. rate 18, height 5' 9 (1.753 m), weight 120.2 kg, SpO2 100%. Body mass index  is 39.13 kg/m.  Diagnosis: Principal Problem:   MDD (major depressive disorder), recurrent, severe, with psychosis (HCC)   PLAN: Safety and Monitoring:  -- Voluntary admission to inpatient psychiatric unit for safety, stabilization and treatment  -- Daily contact with patient to assess and evaluate symptoms and progress in treatment  -- Patient's case to be discussed in multi-disciplinary team meeting  -- Observation Level : q15 minute checks  -- Vital signs:  q12 hours  -- Precautions: suicide, elopement, and assault -- Encouraged patient to participate in unit milieu and in scheduled group therapies  2. Psychiatric Treatment:  Scheduled Medications:  Seroquel  400 mg at bedtime Propranolol  10 mg 3 times daily Vraylar  6 mg once daily Lamictal  200 mg once daily Buspirone  15 mg 3 times daily    -- The risks/benefits/side-effects/alternatives to this medication were discussed in detail with the patient and time was given for questions. The patient consents to medication trial.  3. Medical Issues Being Addressed:  No acute concerns.   4. Discharge Planning:             -- Likely Monday  -- Social work and case management to assist with discharge planning and identification of hospital follow-up needs prior to discharge  -- Estimated LOS: 3-4 days  Millie JONELLE Manners, MD 11/24/2024, 9:28 AM

## 2024-11-24 NOTE — B[facility] Counselor (Signed)
 Patient requested to apeak with a social worker regarding her cats. The patient asked that the SW contact mom and ask her to call ex and inquire about the cats.   The LCSWA contacted mom and ask that she contact the ex regarding the cats. Mom stated that she would.

## 2024-11-24 NOTE — Progress Notes (Signed)
   11/23/24 2100  Psych Admission Type (Psych Patients Only)  Admission Status Involuntary  Psychosocial Assessment  Patient Complaints None  Eye Contact Fair  Facial Expression Other (Comment) (WDL)  Affect Appropriate to circumstance  Speech Logical/coherent  Interaction Assertive  Motor Activity Other (Comment) (WDL)  Appearance/Hygiene Unremarkable  Behavior Characteristics Cooperative;Appropriate to situation  Mood Depressed;Anxious  Thought Process  Coherency WDL  Content WDL  Delusions None reported or observed  Perception WDL  Hallucination None reported or observed  Judgment Limited  Confusion None  Danger to Self  Current suicidal ideation? Denies  Agreement Not to Harm Self Yes  Description of Agreement Verbal  Danger to Others  Danger to Others None reported or observed

## 2024-11-24 NOTE — Plan of Care (Signed)

## 2024-11-24 NOTE — Group Note (Signed)
 Date:  11/24/2024 Time:  10:10 AM  Group Topic/Focus:  Goals Group:   The focus of this group is to help patients establish daily goals to achieve during treatment and discuss how the patient can incorporate goal setting into their daily lives to aide in recovery.    Participation Level:  Did Not Attend  Megan Saunders 11/24/2024, 10:10 AM

## 2024-11-25 ENCOUNTER — Other Ambulatory Visit: Payer: Self-pay

## 2024-11-25 NOTE — Progress Notes (Signed)
   11/25/24 0900  Psych Admission Type (Psych Patients Only)  Admission Status Involuntary  Psychosocial Assessment  Patient Complaints None  Eye Contact Fair  Facial Expression Worried  Affect Appropriate to circumstance  Speech Logical/coherent  Interaction Assertive  Motor Activity Slow  Appearance/Hygiene Unremarkable  Behavior Characteristics Cooperative;Appropriate to situation  Mood Pleasant  Aggressive Behavior  Effect No apparent injury  Thought Process  Coherency WDL  Content WDL  Delusions None reported or observed  Perception WDL  Hallucination None reported or observed  Judgment WDL  Confusion None  Danger to Self  Current suicidal ideation? Denies  Self-Injurious Behavior No self-injurious ideation or behavior indicators observed or expressed   Agreement Not to Harm Self Yes  Description of Agreement Verbal  Danger to Others  Danger to Others Reported or observed  Danger to Others Abnormal  Harmful Behavior to others No threats or harm toward other people  Destructive Behavior No threats or harm toward property   Patient's goal for today, per her self-inventory is to leave, in which she will act right in order to achieve her goal.

## 2024-11-25 NOTE — Progress Notes (Signed)
  [facility] Adult Case Management Discharge Plan :  Will you be returning to the same living situation after discharge:  No. Patient to go to a friend's home.  At discharge, do you have transportation home?: Yes,  CSW has arranged transportation on patient's behalf.  Do you have the ability to pay for your medications: Yes,  [employer]  Release of information consent forms completed and in the chart;  Patient's signature needed at discharge.  Patient to Follow up at:  Follow-up Information     [facility] Follow up.   Why: Please follow up with your therapist following your discharge. Contact information: [address] [place], [place] [address]  Phone: [phone] Fax: [phone] Email: Moss ann        [facility] Follow up.   Why: Please follow up with your psychiatrist following your discharge. Contact information: [address],  [place], [place] [address]  Phone: [phone] Fax:  [phone]        [facility] Follow up.   Why: In person primary care appointment is 02/12/24 at 9 AM. Contact information: [address] [place], [place] [address]  Main: [phone] Pharmacy: [phone] Dental: [phone] Fax: [phone] After Hours: [phone]                Next level of care provider has access to [facility] Link:no  Safety Planning and Suicide Prevention discussed: Chaney [name], [phone], Father;Shonte Schmale, [phone]     Has patient been referred to the Quitline?: Patient refused referral for treatment  Patient has been referred for addiction treatment: No known substance use disorder.  Alveta CHRISTELLA Kerns, LCSW 11/25/2024, 10:03 AM
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
## 2024-11-25 NOTE — BHH Counselor (Signed)
 Referral sent to [facility] group home at patient's request.   Brynn Mulgrew, MSW, [facility] 11/25/2024 2:47 PM

## 2024-11-25 NOTE — Group Note (Signed)
 [facility] LCSW Group Therapy Note   Group Date: 11/25/2024 Start Time: 1300 End Time: 1400   Type of Therapy/Topic:  Group Therapy:  Balance in Life  Participation Level:  Active   Description of Group:    This group will address the concept of balance and how it feels and looks when one is unbalanced. Patients will be encouraged to process areas in their lives that are out of balance, and identify reasons for remaining unbalanced. Facilitators will guide patients utilizing problem- solving interventions to address and correct the stressor making their life unbalanced. Understanding and applying boundaries will be explored and addressed for obtaining  and maintaining a balanced life. Patients will be encouraged to explore ways to assertively make their unbalanced needs known to significant others in their lives, using other group members and facilitator for support and feedback.  Therapeutic Goals: Patient will identify two or more emotions or situations they have that consume much of in their lives. Patient will identify signs/triggers that life has become out of balance:  Patient will identify two ways to set boundaries in order to achieve balance in their lives:  Patient will demonstrate ability to communicate their needs through discussion and/or role plays  Summary of Patient Progress: The group focused on finding balance in life by identifying, understanding, and maintaining the values most important to them. By exploring their personal values, members were able to recognize healthy behaviors that support and strengthen those values. Patients learned to use these values as a foundation for developing healthy habits, routines, and skills that reflect and reinforce their priorities. The patient was engaged throughout the session and contributed meaningfully to the group's progress.    Therapeutic Modalities:   Cognitive Behavioral Therapy Solution-Focused Therapy Assertiveness  Training   Alveta CHRISTELLA Kerns, LCSW

## 2024-11-25 NOTE — Progress Notes (Signed)
 Patient ID: Megan Saunders, female   DOB: 29-Nov-2001, 23 y.o.   MRN: [id number]  Discharge Note:  Patient denies SI/HI/AVH at this time. Discharge instructions, AVS, medication supply, and transition record gone over with patient. Patient given a copy of her Suicide Safety Plan. Patient agrees to comply with medication management, follow-up visit, and outpatient therapy. Patient belongings returned to patient. Patient questions and concerns addressed and answered. Patient ambulatory off unit. Patient discharged to home with her friend.

## 2024-11-25 NOTE — BHH Counselor (Signed)
   CSW touched base with Megan Saunders and [facility] at [phone] to follow up on referral.  Megan Saunders reported that he does not believe the patient would be the right fit. Megan Saunders reported the demographic is mainly older people in the group home she would be assigned to.  This will be communicated to patient.   CSW to continue to engage in safe discharge planning.   Kwadwo Taras, MSW, LCSWA 11/25/2024 8:38 AM

## 2024-11-25 NOTE — BHH Counselor (Signed)
 CSW touched base with patient's friend, [name] [phone]) who confirmed that patient can be discharged to her home.   CSW to continue to assess.   Santosha Jividen, MSW, LCSWA 11/25/2024 9:15 AM

## 2024-11-25 NOTE — Plan of Care (Signed)

## 2024-11-25 NOTE — Discharge Summary (Cosign Needed Addendum)
 Physician Discharge Summary Note  Patient:  Megan Saunders is an 23 y.o., female MRN:  [id number] DOB:  2001/05/31 Patient phone:  [phone] (home)  Patient address:   [address] [place] [address],   Total time spent: 40 min Date of Admission:  11/20/2024 Date of Discharge: 11/25/2024  Reason for Admission:  Suicidal ideation   Principal Problem: MDD (major depressive disorder), recurrent, severe, with psychosis (HCC) Discharge Diagnoses: Principal Problem:   MDD (major depressive disorder), recurrent, severe, with psychosis (HCC)   Past Psychiatric History: see h&p  Family Psychiatric  History: see h&p Social History:  Social History   Substance and Sexual Activity  Alcohol Use Yes   Alcohol/week: 6.0 standard drinks of alcohol   Types: 2 Glasses of wine, 4 Shots of liquor per week   Comment: Last drink last night (12/14/23) Drinks once monthly     Social History   Substance and Sexual Activity  Drug Use Yes   Frequency: 1.0 times per week   Types: Marijuana    Social History   Socioeconomic History   Marital status: Single    Spouse name: Not on file   Number of children: Not on file   Years of education: Not on file   Highest education level: Not on file  Occupational History   Not on file  Tobacco Use   Smoking status: Every Day    Types: Cigars    Start date: 11/18/2021   Smokeless tobacco: Never  Vaping Use   Vaping status: Never Used  Substance and Sexual Activity   Alcohol use: Yes    Alcohol/week: 6.0 standard drinks of alcohol    Types: 2 Glasses of wine, 4 Shots of liquor per week    Comment: Last drink last night (12/14/23) Drinks once monthly   Drug use: Yes    Frequency: 1.0 times per week    Types: Marijuana   Sexual activity: Yes    Partners: Male, Female    Birth control/protection: Condom, OCP    Comment: Condom use sometimes. Last sex today (12/15/23) w/o condom today.  Other Topics Concern   Not on file  Social  History Narrative   ** Merged History Encounter **       Social Drivers of Health   Financial Resource Strain: High Risk (01/25/2023)   Received from [facility]   Overall Financial Resource Strain (CARDIA)    Difficulty of Paying Living Expenses: Very hard  Food Insecurity: Food Insecurity Present (11/20/2024)   Hunger Vital Sign    Worried About Running Out of Food in the Last Year: Sometimes true    Ran Out of Food in the Last Year: Never true  Transportation Needs: Unmet Transportation Needs (11/20/2024)   PRAPARE - Administrator, Civil Service (Medical): Yes    Lack of Transportation (Non-Medical): No  Physical Activity: Sufficiently Active (04/12/2022)   Received from [facility]   Exercise Vital Sign    On average, how many days per week do you engage in moderate to strenuous exercise (like a brisk walk)?: 7 days    On average, how many minutes do you engage in exercise at this level?: 60 min  Stress: Stress Concern Present (04/12/2022)   Received from [facility] - Occupational Stress Questionnaire    Feeling of Stress : Very much  Social Connections: Socially Isolated (04/12/2022)   Received from [facility]  Social Connection and Isolation Panel    In a typical week, how many times do you talk on the phone with family, friends, or neighbors?: Once a week    How often do you get together with friends or relatives?: Never    How often do you attend church or religious services?: Never    Do you belong to any clubs or organizations such as church groups, unions, fraternal or athletic groups, or school groups?: No    How often do you attend meetings of the clubs or organizations you belong to?: Never    Are you married, widowed, divorced, separated, never married, or living with a partner?: Living with partner   Past Medical History:  Past Medical  History:  Diagnosis Date   Anxiety    History reviewed. No pertinent surgical history. Family History: History reviewed. No pertinent family history.  Hospital Course:   On admission, patient presented under IVC after an argument with her mother during which she reportedly made suicidal statements and attempted to open the car door while it was moving.  During hospitalization, patient showed gradual improvement in symptoms, responding well to treatment with Seroquel , propranolol , Vraylar , Lamictal , and buspirone  with good tolerability.  She remained linear, logical, and future oriented.  She consistently denied SI/HI/plan and denied hallucinations on serial interviews.  She participated appropriately in group therapies and was able to demonstrate coping skills.  She maintained safe behaviors on the unit.  She was able to discuss support system, coping skills, and crisis resources.  Social work team sent a referral to group home on patient's behalf at patient's request. Patient engaged in safe discharge planning.   Detailed risk assessment is complete based on clinical exam and individual risk factors and acute suicide risk is low and acute violence risk is low.    On the day of discharge, patient denies SI/HI/plan and denies hallucinations.  Patient remains future oriented and is willing to participate in outpatient mental health services.  Currently, all modifiable risk of harm to self/harm to others have been addressed and patient is no longer appropriate for the acute inpatient setting and is able to continue treatment for mental health needs in the community with the supports as indicated below.  Patient is educated and verbalized understanding of discharge plan of care including medications, follow-up appointments, mental health resources and further crisis services in the community.  She is instructed to call 911 or present to the nearest emergency room should she experience any decompensation in  mood, disturbance of bowel or return of suicidal/homicidal ideations.  Patient verbalizes understanding of this education and agrees to this plan of care  Physical Findings: AIMS:  , ,  ,  ,    CIWA:    COWS:      Psychiatric Specialty Exam:  Presentation  General Appearance:  Appropriate for Environment  Eye Contact: Good  Speech: Clear and Coherent  Speech Volume: Normal    Mood and Affect  Mood: Euthymic  Affect: Appropriate   Thought Process  Thought Processes: Coherent; Linear  Descriptions of Associations:Intact  Orientation:Full (Time, Place and Person)  Thought Content:Logical  Hallucinations:Hallucinations: None  Ideas of Reference:None  Suicidal Thoughts:Suicidal Thoughts: No  Homicidal Thoughts:Homicidal Thoughts: No   Sensorium  Memory: Immediate Fair; Recent Fair  Judgment: Fair  Insight: Fair   Executive Functions  Concentration: Good  Attention Span: Good  Recall: Good  Fund of Knowledge: Fair  Language: Fair   Psychomotor Activity  Psychomotor Activity: Psychomotor Activity:  Normal  Musculoskeletal: Strength & Muscle Tone: within normal limits Gait & Station: normal Assets  Assets: Manufacturing Systems Engineer; Desire for Improvement; Social Support   Sleep  Sleep: Sleep: Fair    Physical Exam: Physical Exam Constitutional:      Appearance: Normal appearance.  HENT:     Head: Normocephalic and atraumatic.     Nose: Nose normal.  Eyes:     Conjunctiva/sclera: Conjunctivae normal.  Pulmonary:     Effort: Pulmonary effort is normal.  Neurological:     Mental Status: She is alert and oriented to person, place, and time.  Psychiatric:        Attention and Perception: Attention and perception normal. She does not perceive auditory or visual hallucinations.        Mood and Affect: Mood and affect normal.        Speech: Speech normal.        Behavior: Behavior is cooperative.        Thought Content:  Thought content is not paranoid or delusional. Thought content does not include homicidal or suicidal ideation. Thought content does not include homicidal or suicidal plan.        Cognition and Memory: Cognition normal.        Judgment: Judgment normal.    Review of Systems  Psychiatric/Behavioral:  Negative for depression, hallucinations, substance abuse and suicidal ideas. The patient is nervous/anxious. The patient does not have insomnia.    Blood pressure 133/85, pulse (!) 104, temperature 97.9 F (36.6 C), temperature source Oral, resp. rate 18, height 5' 9 (1.753 m), weight 120.2 kg, SpO2 100%. Body mass index is 39.13 kg/m.   Social History   Tobacco Use  Smoking Status Every Day   Types: Cigars   Start date: 11/18/2021  Smokeless Tobacco Never   Tobacco Cessation:  A prescription for an FDA-approved tobacco cessation medication was offered at discharge and the patient refused   Blood Alcohol level:  Lab Results  Component Value Date   [facility] <15 11/19/2024   ETH <15 04/26/2024    Metabolic Disorder Labs:  Lab Results  Component Value Date   HGBA1C 5.7 (H) 11/21/2024   MPG 117 11/21/2024   MPG 105.41 04/26/2018   No results found for: PROLACTIN Lab Results  Component Value Date   CHOL 174 11/21/2024   TRIG 152 (H) 11/21/2024   HDL 66 11/21/2024   CHOLHDL 2.7 11/21/2024   VLDL 30 11/21/2024   LDLCALC 78 11/21/2024   LDLCALC 91 04/26/2018    See Psychiatric Specialty Exam and Suicide Risk Assessment completed by Attending Physician prior to discharge.  Discharge destination:  Home  Is patient on multiple antipsychotic therapies at discharge:  Yes,   Do you recommend tapering to monotherapy for antipsychotics?  Yes, as patient maintains stability of psychiatric symptoms in outpatient setting, can be titrated back to monotherapy under outpatient management and supervision Has Patient had three or more failed trials of antipsychotic monotherapy by history:   No  Recommended Plan for Multiple Antipsychotic Therapies: Taper to monotherapy as described:   as patient maintains stability of psychiatric symptoms in outpatient setting, can be titrated back to [facility] outpatient management and supervision  Discharge Instructions     Diet - low sodium heart healthy   Complete by: As directed    Increase activity slowly   Complete by: As directed       Allergies as of 11/25/2024       Reactions   Dust Mite Extract  Grass Extracts [gramineae Pollens]    Mold Extract [trichophyton]    Other    Cat and dog dander   Pollen Extract         Medication List     STOP taking these medications    buPROPion  150 MG 24 hr tablet Commonly known as: WELLBUTRIN  XL   buPROPion  300 MG 24 hr tablet Commonly known as: WELLBUTRIN  XL   clomiPRAMINE 50 MG capsule Commonly known as: ANAFRANIL   cloNIDine HCl 0.1 MG Tb12 ER tablet Commonly known as: KAPVAY   Vilazodone HCl 40 MG Tabs Commonly known as: VIIBRYD   Vyvanse 40 MG capsule Generic drug: lisdexamfetamine       TAKE these medications      Indication  busPIRone  15 MG tablet Commonly known as: BUSPAR  Take 1 tablet (15 mg total) by mouth 3 (three) times daily. What changed: Another medication with the same name was removed. Continue taking this medication, and follow the directions you see here.  Indication: Anxiety Disorder   Cariprazine  HCl 6 MG Caps Take 1 capsule (6 mg total) by mouth daily. What changed:  medication strength how much to take Another medication with the same name was removed. Continue taking this medication, and follow the directions you see here.  Indication: Major Depressive Disorder   lamoTRIgine  200 MG tablet Commonly known as: LAMICTAL  Take 1 tablet (200 mg total) by mouth daily. What changed: Another medication with the same name was removed. Continue taking this medication, and follow the directions you see here.  Indication: Mood  disorder   propranolol  10 MG tablet Commonly known as: INDERAL  Take 1 tablet (10 mg total) by mouth 3 (three) times daily. What changed: Another medication with the same name was removed. Continue taking this medication, and follow the directions you see here.  Indication: Feeling Anxious   QUEtiapine  400 MG tablet Commonly known as: SEROQUEL  Take 1 tablet (400 mg total) by mouth at bedtime. What changed: Another medication with the same name was removed. Continue taking this medication, and follow the directions you see here.  Indication: Major Depressive Disorder        Follow-up Information     [facility] Follow up.   Why: Please follow up with your therapist following your discharge. Contact information: [address] [place], [place] [address]  Phone: [phone] Fax: [phone] Email: [email or web address]        [facility] Follow up.   Why: Please follow up with your psychiatrist following your discharge. Contact information: [address]  [place], [place] [address]  Phone: [phone] Fax:  [phone]        [facility] Follow up.   Why: In person primary care appointment is 02/12/24 at 9 AM. Contact information: [address], [place], [place] [address]  Main: [phone] Pharmacy: [phone] Dental: [phone] Fax: [phone] After Hours: [phone]                Follow-up recommendations:  Activity:  as tolerated     Signed: Camelia LITTIE Lukes, PA-C 11/25/2024, 11:45 AM

## 2024-11-25 NOTE — Group Note (Signed)
 Recreation Therapy Group Note   Group Topic:Coping Skills  Group Date: 11/25/2024 Start Time: 1040 End Time: 1120 Facilitators: Celestia Jeoffrey BRAVO, LRT, CTRS Location: Craft Room  Group Description: Mind Map.  Patient was provided a blank template of a diagram with 32 blank boxes in a tiered system, branching from the center (similar to a bubble chart). LRT directed patients to label the middle of the diagram Coping Skills. LRT and patients then came up with 8 different coping skills as examples. Pt were directed to record their coping skills in the 2nd tier boxes closest to the center.  Patients would then share their coping skills with the group as LRT wrote them out. LRT gave a handout of 99 different coping skills at the end of group.   Goal Area(s) Addressed: Patients will be able to define "coping skills". Patient will identify new coping skills.  Patient will increase communication.   Affect/Mood: N/A   Participation Level: Did not attend    Clinical Observations/Individualized Feedback: Patient did not attend group.   Plan: Continue to engage patient in RT group sessions 2-3x/week.   Jeoffrey BRAVO Celestia, LRT, CTRS 11/25/2024 11:37 AM

## 2024-12-04 ENCOUNTER — Encounter: Payer: Self-pay | Admitting: Internal Medicine

## 2024-12-04 ENCOUNTER — Other Ambulatory Visit (HOSPITAL_COMMUNITY)
Admission: RE | Admit: 2024-12-04 | Discharge: 2024-12-04 | Disposition: A | Payer: MEDICAID | Source: Ambulatory Visit | Attending: Internal Medicine | Admitting: Internal Medicine

## 2024-12-04 ENCOUNTER — Telehealth: Payer: Self-pay

## 2024-12-04 ENCOUNTER — Other Ambulatory Visit: Payer: Self-pay

## 2024-12-04 ENCOUNTER — Ambulatory Visit: Payer: MEDICAID | Admitting: Internal Medicine

## 2024-12-04 VITALS — BP 118/74 | HR 89 | Temp 97.9°F | Resp 18 | Ht 69.0 in | Wt 278.2 lb

## 2024-12-04 DIAGNOSIS — F603 Borderline personality disorder: Secondary | ICD-10-CM

## 2024-12-04 DIAGNOSIS — F909 Attention-deficit hyperactivity disorder, unspecified type: Secondary | ICD-10-CM | POA: Diagnosis not present

## 2024-12-04 DIAGNOSIS — F419 Anxiety disorder, unspecified: Secondary | ICD-10-CM | POA: Diagnosis not present

## 2024-12-04 DIAGNOSIS — L732 Hidradenitis suppurativa: Secondary | ICD-10-CM | POA: Diagnosis not present

## 2024-12-04 DIAGNOSIS — Z113 Encounter for screening for infections with a predominantly sexual mode of transmission: Secondary | ICD-10-CM | POA: Diagnosis not present

## 2024-12-04 DIAGNOSIS — R7303 Prediabetes: Secondary | ICD-10-CM

## 2024-12-04 DIAGNOSIS — Z3041 Encounter for surveillance of contraceptive pills: Secondary | ICD-10-CM

## 2024-12-04 MED ORDER — CHLORHEXIDINE GLUCONATE 4 % EX SOLN: Freq: Every day | CUTANEOUS | 1 refills | Status: AC | PRN

## 2024-12-04 MED ORDER — CLINDAMYCIN PHOS (TWICE-DAILY) 1 % EX GEL: Freq: Two times a day (BID) | CUTANEOUS | 0 refills | Status: AC

## 2024-12-04 MED ORDER — APRI 0.15-30 MG-MCG PO TABS: 1.0000 | ORAL_TABLET | Freq: Every day | ORAL | 11 refills | Status: AC

## 2024-12-04 NOTE — Telephone Encounter (Unsigned)
 Copied from CRM [id number]. Topic: General - Other >> Dec 04, 2024 12:14 PM Victoria B wrote: Reason for CRM: patient called, requesting Fl2 form to be faxed to [facility] at [phone]

## 2024-12-04 NOTE — Patient Instructions (Signed)
 It was great seeing you today!  Plan discussed at today's visit: -Blood work ordered today, results will be uploaded to MyChart.   Follow up in: 6 months  Take care and let us  know if you have any questions or concerns prior to your next visit.  Dr. Bernardo  Hidradenitis Suppurativa Hidradenitis suppurativa is a long-term (chronic) skin disease. It is similar to a severe form of acne, but it affects areas of the body where acne would be unusual, especially areas of the body where skin rubs against skin and becomes moist. These include: Underarms. Groin. Genital area. Buttocks. Upper thighs. Breasts. Hidradenitis suppurativa may start out as small lumps or pimples caused by blocked skin pores, sweat glands, or hair follicles. Pimples may develop into deep sores that break open (rupture) and drain pus. Over time, affected areas of skin may thicken and become scarred. This condition is rare and does not spread from person to person (non-contagious). What are the causes? The exact cause of this condition is not known. It may be related to: Female and female hormones. An overactive disease-fighting system (immune system). The immune system may over-react to blocked hair follicles or sweat glands and cause swelling and pus-filled sores. What increases the risk? You are more likely to develop this condition if you: Are female. Are 29-25 years old. Have a family history of hidradenitis suppurativa. Have a personal history of acne. Are overweight. Smoke. Take the medicine lithium. What are the signs or symptoms? The first symptoms are usually painful bumps in the skin, similar to pimples. The condition may get worse over time (progress), or it may only cause mild symptoms. If the disease progresses, symptoms may include: Skin bumps getting bigger and growing deeper into the skin. Bumps rupturing and draining pus. Itchy, infected skin. Skin getting thicker and scarred. Tunnels under the  skin (fistulas) where pus drains from a bump. Pain during daily activities, such as pain during walking if your groin area is affected. Emotional problems, such as stress or depression. This condition may affect your appearance and your ability or willingness to wear certain clothes or do certain activities. How is this diagnosed? This condition is diagnosed by a health care provider who specializes in skin conditions (dermatologist). You may be diagnosed based on: Your symptoms and medical history. A physical exam. Testing a pus sample for infection. Blood tests. How is this treated? Your treatment will depend on how severe your symptoms are. The same treatment will not work for everybody with this condition. You may need to try several treatments to find what works best for you. Treatment may include: Cleaning and bandaging (dressing) your wounds as needed. Lifestyle changes, such as new skin care routines. Taking medicines, such as: Antibiotics. Acne medicines. Medicines to reduce the activity of the immune system. A diabetes medicine (metformin). Birth control pills, for women. Steroids to reduce swelling and pain. Working with a mental health care provider, if you experience emotional distress due to this condition. If you have severe symptoms that do not get better with medicine, you may need surgery. Surgery may involve: Using a laser to clear the skin and remove hair follicles. Opening and draining deep sores. Removing the areas of skin that are diseased and scarred. Follow these instructions at home: Medicines  Take over-the-counter and prescription medicines only as told by your health care provider. If you were prescribed antibiotics, take them as told by your health care provider. Do not stop using the antibiotic even if your  condition improves. Skin care If you have open wounds, cover them with a clean dressing as told by your health care provider. Keep wounds clean by  washing them gently with soap and water when you bathe. Do not shave the areas where you get hidradenitis suppurativa. Wear loose-fitting clothes. Try to avoid getting overheated or sweaty. If you get sweaty or wet, change into clean, dry clothes as soon as you can. To help relieve pain and itchiness, cover sore areas with a warm, clean washcloth (warm compress) for 5-10 minutes as often as needed. Your healthcare provider may recommend an antiperspirant deodorant that may be gentle on your skin. A daily antiseptic wash to cleanse affected areas may be suggested by your healthcare provider. General instructions Learn as much as you can about your disease so that you have an active role in your treatment. Work closely with your health care provider to find treatments that work for you. If you are overweight, work with your health care provider to lose weight as recommended. Do not use any products that contain nicotine or tobacco. These products include cigarettes, chewing tobacco, and vaping devices, such as e-cigarettes. If you need help quitting, ask your health care provider. If you struggle with living with this condition, talk with your health care provider or work with a mental health care provider as recommended. Keep all follow-up visits. Where to find more information Hidradenitis Suppurativa Foundation, Inc.: www.hs-foundation.org American Academy of Dermatology: infoexam.si Contact a health care provider if: You have a flare-up of hidradenitis suppurativa. You have a fever or chills. You have trouble controlling your symptoms at home. You have trouble doing your daily activities because of your symptoms. You have trouble dealing with emotional problems related to your condition. Summary Hidradenitis suppurativa is a long-term (chronic) skin disease. It is similar to a severe form of acne, but it affects areas of the body where acne would be unusual. The first symptoms are usually  painful bumps in the skin, similar to pimples. The condition may only cause mild symptoms, or it may get worse over time (progress). If you have open wounds, cover them with a clean dressing as told by your health care provider. Keep wounds clean by washing them gently with soap and water when you bathe. Besides skin care, treatment may include medicines, laser treatment, and surgery. This information is not intended to replace advice given to you by your health care provider. Make sure you discuss any questions you have with your health care provider. Document Revised: 01/26/2022 Document Reviewed: 01/26/2022 Elsevier Patient Education  2024 [employer].
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
## 2024-12-05 LAB — CERVICOVAGINAL ANCILLARY ONLY
Bacterial Vaginitis (gardnerella): NEGATIVE
Candida Glabrata: NEGATIVE
Candida Vaginitis: NEGATIVE
Chlamydia: NEGATIVE
Comment: NEGATIVE
Comment: NEGATIVE
Comment: NEGATIVE
Comment: NEGATIVE
Comment: NEGATIVE
Comment: NORMAL
Neisseria Gonorrhea: NEGATIVE
Trichomonas: NEGATIVE

## 2024-12-05 NOTE — Telephone Encounter (Signed)
 faxed

## 2024-12-06 ENCOUNTER — Ambulatory Visit: Payer: Self-pay | Admitting: Internal Medicine

## 2024-12-07 LAB — HIV ANTIBODY (ROUTINE TESTING W REFLEX)
HIV 1&2 Ab, 4th Generation: NONREACTIVE
HIV FINAL INTERPRETATION: NEGATIVE

## 2024-12-07 LAB — SYPHILIS: RPR W/REFLEX TO RPR TITER AND TREPONEMAL ANTIBODIES, TRADITIONAL SCREENING AND DIAGNOSIS ALGORITHM: RPR Ser Ql: NONREACTIVE

## 2024-12-27 ENCOUNTER — Other Ambulatory Visit: Payer: Self-pay | Admitting: Internal Medicine

## 2024-12-30 ENCOUNTER — Emergency Department
Admission: EM | Admit: 2024-12-30 | Discharge: 2025-01-01 | Disposition: A | Payer: MEDICAID | Attending: Emergency Medicine | Admitting: Emergency Medicine

## 2024-12-30 DIAGNOSIS — X788XXA Intentional self-harm by other sharp object, initial encounter: Secondary | ICD-10-CM | POA: Diagnosis not present

## 2024-12-30 DIAGNOSIS — Z638 Other specified problems related to primary support group: Secondary | ICD-10-CM | POA: Diagnosis not present

## 2024-12-30 DIAGNOSIS — F603 Borderline personality disorder: Secondary | ICD-10-CM | POA: Insufficient documentation

## 2024-12-30 DIAGNOSIS — R4588 Nonsuicidal self-harm: Secondary | ICD-10-CM | POA: Diagnosis not present

## 2024-12-30 DIAGNOSIS — Z046 Encounter for general psychiatric examination, requested by authority: Secondary | ICD-10-CM | POA: Diagnosis not present

## 2024-12-30 DIAGNOSIS — Z23 Encounter for immunization: Secondary | ICD-10-CM | POA: Insufficient documentation

## 2024-12-30 DIAGNOSIS — F32A Depression, unspecified: Secondary | ICD-10-CM | POA: Insufficient documentation

## 2024-12-30 DIAGNOSIS — S6991XA Unspecified injury of right wrist, hand and finger(s), initial encounter: Secondary | ICD-10-CM | POA: Diagnosis present

## 2024-12-30 DIAGNOSIS — R451 Restlessness and agitation: Secondary | ICD-10-CM

## 2024-12-30 DIAGNOSIS — S61011A Laceration without foreign body of right thumb without damage to nail, initial encounter: Secondary | ICD-10-CM | POA: Diagnosis not present

## 2024-12-30 DIAGNOSIS — S51812A Laceration without foreign body of left forearm, initial encounter: Secondary | ICD-10-CM | POA: Diagnosis not present

## 2024-12-30 DIAGNOSIS — F429 Obsessive-compulsive disorder, unspecified: Secondary | ICD-10-CM | POA: Diagnosis not present

## 2024-12-30 DIAGNOSIS — F411 Generalized anxiety disorder: Secondary | ICD-10-CM | POA: Diagnosis not present

## 2024-12-30 DIAGNOSIS — F419 Anxiety disorder, unspecified: Secondary | ICD-10-CM | POA: Insufficient documentation

## 2024-12-30 DIAGNOSIS — T50902A Poisoning by unspecified drugs, medicaments and biological substances, intentional self-harm, initial encounter: Secondary | ICD-10-CM | POA: Diagnosis not present

## 2024-12-30 LAB — URINALYSIS, ROUTINE W REFLEX MICROSCOPIC
Bilirubin Urine: NEGATIVE
Glucose, UA: NEGATIVE mg/dL
Hgb urine dipstick: NEGATIVE
Ketones, ur: NEGATIVE mg/dL
Leukocytes,Ua: NEGATIVE
Nitrite: NEGATIVE
Protein, ur: 30 mg/dL — AB
Specific Gravity, Urine: 1.021 (ref 1.005–1.030)
pH: 6 (ref 5.0–8.0)

## 2024-12-30 LAB — CBC WITH DIFFERENTIAL/PLATELET
Abs Immature Granulocytes: 0.07 K/uL (ref 0.00–0.07)
Basophils Absolute: 0 K/uL (ref 0.0–0.1)
Basophils Relative: 0 %
Eosinophils Absolute: 0 K/uL (ref 0.0–0.5)
Eosinophils Relative: 0 %
HCT: 47.3 % — ABNORMAL HIGH (ref 36.0–46.0)
Hemoglobin: 15.1 g/dL — ABNORMAL HIGH (ref 12.0–15.0)
Immature Granulocytes: 1 %
Lymphocytes Relative: 29 %
Lymphs Abs: 3.5 K/uL (ref 0.7–4.0)
MCH: 27.8 pg (ref 26.0–34.0)
MCHC: 31.9 g/dL (ref 30.0–36.0)
MCV: 86.9 fL (ref 80.0–100.0)
Monocytes Absolute: 0.7 K/uL (ref 0.1–1.0)
Monocytes Relative: 5 %
Neutro Abs: 7.9 K/uL — ABNORMAL HIGH (ref 1.7–7.7)
Neutrophils Relative %: 65 %
Platelets: 402 K/uL — ABNORMAL HIGH (ref 150–400)
RBC: 5.44 MIL/uL — ABNORMAL HIGH (ref 3.87–5.11)
RDW: 13.7 % (ref 11.5–15.5)
WBC: 12.2 K/uL — ABNORMAL HIGH (ref 4.0–10.5)
nRBC: 0 % (ref 0.0–0.2)

## 2024-12-30 LAB — COMPREHENSIVE METABOLIC PANEL WITH GFR
ALT: 44 U/L (ref 0–44)
AST: 31 U/L (ref 15–41)
Albumin: 4.9 g/dL (ref 3.5–5.0)
Alkaline Phosphatase: 100 U/L (ref 38–126)
Anion gap: 16 — ABNORMAL HIGH (ref 5–15)
BUN: 10 mg/dL (ref 6–20)
CO2: 19 mmol/L — ABNORMAL LOW (ref 22–32)
Calcium: 9.9 mg/dL (ref 8.9–10.3)
Chloride: 100 mmol/L (ref 98–111)
Creatinine, Ser: 0.74 mg/dL (ref 0.44–1.00)
GFR, Estimated: >60 mL/min
Glucose, Bld: 103 mg/dL — ABNORMAL HIGH (ref 70–99)
Potassium: 3.7 mmol/L (ref 3.5–5.1)
Sodium: 135 mmol/L (ref 135–145)
Total Bilirubin: 0.4 mg/dL (ref 0.0–1.2)
Total Protein: 8.8 g/dL — ABNORMAL HIGH (ref 6.5–8.1)

## 2024-12-30 LAB — POC URINE PREG, ED: Preg Test, Ur: NEGATIVE

## 2024-12-30 LAB — URINE DRUG SCREEN
Amphetamines: NEGATIVE
Barbiturates: NEGATIVE
Benzodiazepines: NEGATIVE
Cocaine: NEGATIVE
Fentanyl: NEGATIVE
Methadone Scn, Ur: NEGATIVE
Opiates: NEGATIVE
Tetrahydrocannabinol: POSITIVE — AB

## 2024-12-30 LAB — MAGNESIUM: Magnesium: 2 mg/dL (ref 1.7–2.4)

## 2024-12-30 LAB — SALICYLATE LEVEL: Salicylate Lvl: <7 mg/dL — ABNORMAL LOW (ref 7.0–30.0)

## 2024-12-30 LAB — ACETAMINOPHEN LEVEL
Acetaminophen (Tylenol), Serum: <10 ug/mL — ABNORMAL LOW (ref 10–30)
Acetaminophen (Tylenol), Serum: <10 ug/mL — ABNORMAL LOW (ref 10–30)

## 2024-12-30 LAB — ETHANOL: Alcohol, Ethyl (B): <15 mg/dL

## 2024-12-30 MED ORDER — TETANUS-DIPHTH-ACELL PERTUSSIS 5-2-15.5 LF-MCG/0.5 IM SUSP
0.5000 mL | Freq: Once | INTRAMUSCULAR | Status: AC
  Administered 2024-12-30: 0.5 mL via INTRAMUSCULAR
  Filled 2024-12-30: qty 0.5

## 2024-12-30 MED ORDER — LIDOCAINE HCL (PF) 1 % IJ SOLN
5.0000 mL | Freq: Once | INTRAMUSCULAR | Status: AC
  Administered 2024-12-30: 5 mL
  Filled 2024-12-30: qty 5

## 2024-12-30 MED ORDER — ACETAMINOPHEN 500 MG PO TABS: 1000.0000 mg | ORAL_TABLET | Freq: Once | ORAL | Status: DC

## 2024-12-30 MED ORDER — LORAZEPAM 0.5 MG PO TABS
0.5000 mg | ORAL_TABLET | Freq: Once | ORAL | Status: AC
  Administered 2024-12-30: 0.5 mg via ORAL
  Filled 2024-12-30: qty 1
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
## 2024-12-30 NOTE — ED Notes (Signed)
 Patient Belongings  -Multiple facial piercings and earrings -2 necklaces -b&w stripped hoodie -b&w stripped pj pants -long black boots -grey T-shirt -cell phone

## 2024-12-30 NOTE — ED Notes (Signed)
 VOL/ Moved from Rm-23 to BHU-5

## 2024-12-30 NOTE — Telephone Encounter (Signed)
Requested medication (s) are due for refill today: yes  Requested medication (s) are on the active medication list: yes  Last refill:  11/24/24  Future visit scheduled: no  Notes to clinic:  Unable to refill per protocol, last refill by another provider. Unable to refill per protocol, cannot delegate.      Requested Prescriptions  Pending Prescriptions Disp Refills   QUEtiapine  (SEROQUEL ) 400 MG tablet [Pharmacy Med Name: QUETIAPINE  FUMARATE 400 MG TAB] 30 tablet 1    Sig: TAKE 1 TABLET BY MOUTH EVERYDAY AT BEDTIME     Not Delegated - Psychiatry:  Antipsychotics - Second Generation (Atypical) - quetiapine  Failed - 12/30/2024 10:56 AM      Failed - This refill cannot be delegated      Failed - TSH in normal range and within 360 days    TSH  Date Value Ref Range Status  04/26/2018 1.463 0.400 - 5.000 uIU/mL Final    Comment:    Performed by a 3rd Generation assay with a functional sensitivity of <=0.01 uIU/mL. Performed at [facility], [address] [place], [place] [address]          Failed - Lipid Panel in normal range within the last 12 months    Cholesterol  Date Value Ref Range Status  11/21/2024 174 0 - 200 mg/dL Final    Comment:           ATP III CLASSIFICATION:  <200     mg/dL   Desirable  799-760  mg/dL   Borderline High  >=759    mg/dL   High           LDL Cholesterol  Date Value Ref Range Status  11/21/2024 78 0 - 99 mg/dL Final    Comment:           Total Cholesterol/HDL:CHD Risk Coronary Heart Disease Risk Table                     Men   Women  1/2 Average Risk   3.4   3.3  Average Risk       5.0   4.4  2 X Average Risk   9.6   7.1  3 X Average Risk  23.4   11.0        Use the calculated Patient Ratio above and the CHD Risk Table to determine the patient's CHD Risk.        ATP III CLASSIFICATION (LDL):  <100     mg/dL   Optimal  899-870  mg/dL   Near or Above                    Optimal  130-159  mg/dL   Borderline   839-810  mg/dL   High  >809     mg/dL   Very High Performed at [facility], [address] [place], [place] [address]    HDL  Date Value Ref Range Status  11/21/2024 66 >40 mg/dL Final   Triglycerides  Date Value Ref Range Status  11/21/2024 152 (H) <150 mg/dL Final         Passed - Completed PHQ-2 or PHQ-9 in the last 360 days      Passed - Last BP in normal range    BP Readings from Last 1 Encounters:  12/04/24 118/74         Passed - Last Heart Rate in normal range    Pulse Readings  from Last 1 Encounters:  12/04/24 89         Passed - Valid encounter within last 6 months    Recent Outpatient Visits           3 weeks ago Prediabetes   [facility] [place], [place]              Passed - CBC within normal limits and completed in the last 12 months    WBC  Date Value Ref Range Status  11/19/2024 9.8 4.0 - 10.5 K/uL Final   RBC  Date Value Ref Range Status  11/19/2024 4.45 3.87 - 5.11 MIL/uL Final   Hemoglobin  Date Value Ref Range Status  11/19/2024 12.5 12.0 - 15.0 g/dL Final   HCT  Date Value Ref Range Status  11/19/2024 38.8 36.0 - 46.0 % Final   MCHC  Date Value Ref Range Status  11/19/2024 32.2 30.0 - 36.0 g/dL Final   [facility]  Date Value Ref Range Status  11/19/2024 28.1 26.0 - 34.0 pg Final   MCV  Date Value Ref Range Status  11/19/2024 87.2 80.0 - 100.0 fL Final   No results found for: PLTCOUNTKUC, LABPLAT, POCPLA RDW  Date Value Ref Range Status  11/19/2024 12.9 11.5 - 15.5 % Final         Passed - CMP within normal limits and completed in the last 12 months    Albumin  Date Value Ref Range Status  11/19/2024 4.2 3.5 - 5.0 g/dL Final   Alkaline Phosphatase  Date Value Ref Range Status  11/19/2024 79 38 - 126 U/L Final   ALT  Date Value Ref Range Status  11/19/2024 17 0 - 44 U/L Final   AST  Date Value Ref Range Status  11/19/2024 13 (L) 15 - 41 U/L Final   BUN  Date  Value Ref Range Status  11/19/2024 10 6 - 20 mg/dL Final   Calcium  Date Value Ref Range Status  11/19/2024 9.5 8.9 - 10.3 mg/dL Final   CO2  Date Value Ref Range Status  11/19/2024 25 22 - 32 mmol/L Final   Creatinine, Ser  Date Value Ref Range Status  11/19/2024 0.75 0.44 - 1.00 mg/dL Final   Glucose, Bld  Date Value Ref Range Status  11/19/2024 103 (H) 70 - 99 mg/dL Final    Comment:    Glucose reference range applies only to samples taken after fasting for at least 8 hours.   Potassium  Date Value Ref Range Status  11/19/2024 4.1 3.5 - 5.1 mmol/L Final   Sodium  Date Value Ref Range Status  11/19/2024 139 135 - 145 mmol/L Final   Total Bilirubin  Date Value Ref Range Status  11/19/2024 <0.2 0.0 - 1.2 mg/dL Final   Protein, ur  Date Value Ref Range Status  04/26/2024 30 (A) NEGATIVE mg/dL Final   Total Protein  Date Value Ref Range Status  11/19/2024 7.4 6.5 - 8.1 g/dL Final   GFR calc Af Amer  Date Value Ref Range Status  04/23/2018 NOT CALCULATED >60 mL/min Final    Comment:    (NOTE) The eGFR has been calculated using the CKD EPI equation. This calculation has not been validated in all clinical situations. eGFR's persistently <60 mL/min signify possible Chronic Kidney Disease.    GFR, Estimated  Date Value Ref Range Status  11/19/2024 >60 >60 mL/min Final    Comment:    (NOTE) Calculated using the CKD-EPI Creatinine Equation (  2021) °  ° °  °  °  ° ° °

## 2024-12-30 NOTE — ED Notes (Signed)
 Report called to dolly rn.  Pt to bhu via wheelchair with tech and security
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
## 2024-12-30 NOTE — ED Notes (Signed)
 Poison control called and spoke with Kristi at this time for pt disclosing possible ingestion of unknown amounts of Buspirone . Per EMS bottle dispensed 11/25/2024 and prescribed 15mg  3 times daily. Per pt she took anywhere from 5 pills but also potentially the whole bottle. Poison control recommends EKG, 4 hour repeat tylenol  level, CMP, Mag, UDS, and benzos for agitation. Provider updated.
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
## 2024-12-31 DIAGNOSIS — F603 Borderline personality disorder: Secondary | ICD-10-CM | POA: Diagnosis not present

## 2024-12-31 DIAGNOSIS — F411 Generalized anxiety disorder: Secondary | ICD-10-CM

## 2024-12-31 DIAGNOSIS — F32A Depression, unspecified: Secondary | ICD-10-CM

## 2024-12-31 MED ORDER — CARIPRAZINE HCL 1.5 MG PO CAPS
6.0000 mg | ORAL_CAPSULE | Freq: Every day | ORAL | Status: DC
  Filled 2024-12-31: qty 4

## 2024-12-31 MED ORDER — LISDEXAMFETAMINE DIMESYLATE 20 MG PO CAPS: 40.0000 mg | ORAL_CAPSULE | ORAL | Status: DC

## 2024-12-31 MED ORDER — LAMOTRIGINE 100 MG PO TABS
200.0000 mg | ORAL_TABLET | Freq: Every day | ORAL | Status: DC
  Administered 2024-12-31 – 2025-01-01 (×2): 200 mg via ORAL
  Filled 2024-12-31 (×2): qty 2

## 2024-12-31 MED ORDER — LORAZEPAM 2 MG/ML IJ SOLN
2.0000 mg | Freq: Four times a day (QID) | INTRAMUSCULAR | Status: DC | PRN
  Administered 2024-12-31: 2 mg via INTRAMUSCULAR
  Filled 2024-12-31: qty 1

## 2024-12-31 MED ORDER — VILAZODONE HCL 20 MG PO TABS
40.0000 mg | ORAL_TABLET | Freq: Every day | ORAL | Status: DC
  Administered 2024-12-31: 40 mg via ORAL
  Filled 2024-12-31 (×4): qty 2

## 2024-12-31 MED ORDER — QUETIAPINE FUMARATE 200 MG PO TABS: 400.0000 mg | ORAL_TABLET | Freq: Every day | ORAL | Status: DC

## 2024-12-31 MED ORDER — PROPRANOLOL HCL 10 MG PO TABS: 20.0000 mg | ORAL_TABLET | Freq: Two times a day (BID) | ORAL | Status: DC

## 2024-12-31 MED ORDER — LORAZEPAM 2 MG PO TABS
2.0000 mg | ORAL_TABLET | Freq: Four times a day (QID) | ORAL | Status: DC | PRN
  Administered 2025-01-01: 2 mg via ORAL
  Filled 2024-12-31: qty 1

## 2024-12-31 NOTE — ED Notes (Signed)
 Patient given snack. No acute needs at this time.

## 2024-12-31 NOTE — ED Notes (Signed)
 Meal provided

## 2024-12-31 NOTE — BH Assessment (Addendum)
 Patient has been accepted to [facility] on tomm 01/01/25. Patient assigned to [address]. Accepting physician is Dr. Odella Jumper.  Call report to [phone].  Representative was [employer].   ER Staff is aware of it:  Jon, ER Secretary  Dr. Suzanne, ER MD  Leonor, Patient's Nurse     Patient's Family/Support System [facility]-sister [phone]) has been updated as well.

## 2024-12-31 NOTE — ED Notes (Signed)
Patient in assigned room.

## 2024-12-31 NOTE — ED Notes (Signed)
 Patient sleeping

## 2024-12-31 NOTE — ED Notes (Deleted)
VOL/Pending Placement 

## 2024-12-31 NOTE — ED Notes (Signed)
 Patient in assigned room awake.

## 2024-12-31 NOTE — Consult Note (Signed)
 [facility] Consult Note  Patient Name: Megan Saunders MRN: [id number] DOB: December 22, 2000 DATE OF Consult: 12/31/2024 Consult Order details:  Orders (From admission, onward)     Start     Ordered   12/30/24 1658  CONSULT TO CALL ACT TEAM       Ordering Provider: Ernest Ronal BRAVO, MD  Provider:  (Not yet assigned)  Question:  Reason for Consult?  Answer:  Psych consult   12/30/24 1657   12/30/24 1658  IP CONSULT TO PSYCHIATRY       Ordering Provider: Ernest Ronal BRAVO, MD  Provider:  (Not yet assigned)  Question:  Reason for consult:  Answer:  Medication management   12/30/24 1657            PRIMARY PSYCHIATRIC DIAGNOSES Unspecified depressive disorder; Unspecified anxiety disorder; Borderline personality disorder by present history  Based on my current evaluation and assessment of the patient, she is a 24 y.o. female who presents with complaints of intentional ingestion and multiple self-inflicted lacerations to self (one superficial and one deep) in addition to IVC stating patient was threatening suicide and attempted to leave a moving vehicle. Patient asserting that her behaviors were all impulsive and do not represent any intent to harm self; however, per collateral from therapist, patient with progressively worsening mental health symptoms such that she would benefit from a higher level of care. As such, given the inconsistency between patient's history, IVC documentation and collateral, there is concern that patient may be minimizing or even misrepresenting the severity of her mental health symptoms. The patient's presentation is consistent with Unspecified depressive disorder; Unspecified anxiety disorder; Borderline personality disorder by present history. Therefore, patient does meet criteria for an intensive inpatient psychiatric hospitalization.  RECOMMENDATIONS   Inpatient psychiatric admission recommended?   YES, patient is at high risk to self at this time. Requires  involuntary admission if patient does not agree to voluntary psychiatric admission.   Medication recommendations:  Risks, benefits, side effects and alternatives to treatments reviewed:  -Continue home psychotropic regimen (recommend performing a medication reconciliation with patient's pharmacy to ascertain accuracy of reported regimen): per chart documentation, lamotrigine  200 mg daily for mood instability, vilazodone  40 mg daily for anxiety and depression as these medications do not have a significant impact on QTc prolongation  -Would hold quetiapine  400 mg at bedtime for mood instability, Vyvanse  40 mg daily for inattention, buspirone  15 mg three times daily for generalized anxiety, Vraylar  6 mg daily for mood instability as all these medications can further prolong QTc and patient's QTc is currently prolonged   As needed medications to manage patient's acute symptoms while in hospital care: QTc is 482 ms as of 12/2024  -Maximize utilization of verbal de-escalation techniques, if attempts are unsuccessful and patient poses a threat to self and others: Consider lorazepam  2 mg PO/IM every 6 hours as needed for severe agitation. Would offer patient the option of taking PO medication first, but if patient refuses then may administer IM medication as a last resort. Would avoid antipsychotic PRN medications given concern that patient's QTc is prolonged respiratory depression.   Non-Medication recommendations:  -Please obtain EKG to guide psychotropic management. Note: Please stop all antipsychotic and QTc prolonging medications if patient's QTc is greater than 480 ms. Of note, to decrease the risk of prolonged QTc, please maintain potassium and magnesium  levels within normal ranges. -Agree with work up for organic causes of altered mentation and mood dysregulation, consider the following if not already performed and  clinically appropriate: CT of the head, CBC and differential, basic metabolic profile,  liver function tests (if abnormal consider ammonia level), urinalysis, urine toxicology screen, vitamin B12 level, vitamin D level, TSH with reflex free T4  I personally spent a total of 60 minutes in the care of the patient today including preparing to see the patient, getting/reviewing separately obtained history, performing a medically appropriate exam/evaluation, counseling and educating, placing orders, referring and communicating with other health care professionals, documenting clinical information in the EHR, independently interpreting results, communicating results, and coordinating care.  Thank you for involving us  in the care of this patient. If you have any additional questions or concerns, please call [phone] and ask for me or the provider on-call.  TELEPSYCHIATRY ATTESTATION & CONSENT  As the provider for this telehealth consult, I attest that I verified the patients identity using two separate identifiers, introduced myself to the patient, provided my credentials, disclosed my location, and performed this encounter via a HIPAA-compliant, real-time, face-to-face, two-way, interactive audio and video platform and with the full consent and agreement of the patient (or guardian as applicable.)  Patient physical location: [facility] . Telehealth provider physical location: home office in state of [place].  Video start time: 1220 (Central Time) Video end time: 1240 (Central Time)  IDENTIFYING DATA  Megan Saunders is a 24 y.o. year-old female for whom a psychiatric consultation has been ordered by the primary provider. The patient was identified using two separate identifiers.  CHIEF COMPLAINT/REASON FOR CONSULT  Behavioral health concerns   HISTORY OF PRESENT ILLNESS (HPI)  I evaluated the patient today face-to-face via secure, HIPAA-compliant telepsychiatric connection, and at the request of the primary treatment team. The reason for the  telepsychiatric consultation is that the patient is a 24 year old female who presents for psychiatric evaluation following self-inflicted cutting superficial cut to left wrist, deep laceratin to R thumb/palm and intentional ingestion of unknown amounts of Buspironeshe took anywhere from 5 pills but also potentially the whole bottle in the context of conflict with family. It is documented that patient presented under IVC via [place] [employer] which is documented to have stated that, pt made SI remarks during argument, pt also opened car door and threatened to jump out while car was moving. Primary team is seeking psychotropic medication recommendations, safety evaluation to determine appropriateness for more intensive psychiatric services and diagnostic clarity as to the patient's presentation.   During one-on-one evaluation with this provider, patient was alert and oriented to self and to location and situation. The patient did not appear to be inappropriately internally preoccupied; patient's thought process was linear and concrete. Patient asserted that she and mother have a contentious relationship and she acted on impulse. Patient related that her self-harming behaviors, taking the intentional ingestion and self-cutting, were all in response to feeling overwhelmed with emotional distress given conflict with mother that has caused patient to now be homeless. Patient was then very tearful wherein she explained that she has not been able to get my shit together since being evicted and losing her cats in 08/2024. She has been unemployed and living with mother, with whom she has a contentious relationship. Now patient is homeless and admits that she does not know how to move forward given her limited psychosocial supports, financial resources and struggling mental health. Patient denies suicidal and homicidal intent; however, she was unable to meaningfully contract for safety as she could not engage in  safety or treatment planning  given her continued high levels of demonstrated emotional distress (crying aloud and asserting I don't know repeatedly).   Per collateral from cousin Zola Gully (cousin) who was contacted at [phone] at 12:42 pm CST on 12/31/2024 for a call duration of 6 minutes and 37 seconds: This provider clearly identified self, providing name and role in the care team. Per cousin, she asserted that she is concerned that the patient's environment is negatively impacting her mental health. Cousin feels that if patient finds new, stable housing that she might not pose a risk to self or others. However, if this is not achieved, cousin was not able to confidently state that patient may be able to maintain her mental health stability in the community.  Per collateral from therapist [facility] (therapist) who was contacted at [phone]) [phone] at 12:59 pm CST on 12/31/2024 for a call duration of 3 minutes and 13 seconds: Therapist asserted that patient has been experiencing a decline in her mental health stability, explaining that patient has been hospitalized psychiatrically about 4 times just last year. In fact, therapist has been working toward gathering the paperwork to advocate for patient to receive a higher level of care given patient's continued mental health struggles.   PAST PSYCHIATRIC HISTORY  Inpatient psychiatric treatment: per patient, yes  Outpatient mental health treatment: per patient, she is established with provider that manages her psychotropic medications; per patient, she is established with psychotherapist and sees provider every Thursday; she is established with case management  Current home psychotropic medications: per chart documentation, quetiapine  400 mg at bedtime for mood instability, Vyvanse  40 mg daily for inattention, vilazodone  40 mg daily for anxiety and depression, Vraylar  6 mg daily for mood instability, lamotrigine  200 mg daily for mood instability,  buspirone  15 mg three times daily for generalized anxiety Previous mental health diagnoses: per patient, borderline personality disorder, ADHD, bipolar disorder  Suicide attempts: per patient, in 02/2024  Trauma history: patient did not assert further concerns for abuse, trauma, exploitation or neglect beyond described in the HPI  Otherwise as per HPI above.  PAST MEDICAL HISTORY  Past Medical History:  Diagnosis Date   ADHD    Anxiety    Borderline personality disorder (HCC)    OCD (obsessive compulsive disorder)      HOME MEDICATIONS  Facility Ordered Medications  Medication   [COMPLETED] lidocaine  (PF) (XYLOCAINE ) 1 % injection 5 mL   [COMPLETED] Tdap (ADACEL ) injection 0.5 mL   [COMPLETED] LORazepam  (ATIVAN ) tablet 0.5 mg   PTA Medications  Medication Sig   busPIRone  (BUSPAR ) 15 MG tablet Take 1 tablet (15 mg total) by mouth 3 (three) times daily.   Cariprazine  HCl 6 MG CAPS Take 1 capsule (6 mg total) by mouth daily.   QUEtiapine  (SEROQUEL ) 400 MG tablet Take 1 tablet (400 mg total) by mouth at bedtime.   lamoTRIgine  (LAMICTAL ) 200 MG tablet Take 1 tablet (200 mg total) by mouth daily.   Vilazodone  HCl (VIIBRYD ) 40 MG TABS Take 40 mg by mouth daily.   lisdexamfetamine  (VYVANSE ) 40 MG capsule Take 40 mg by mouth every morning.   propranolol  (INDERAL ) 20 MG tablet Take 20 mg by mouth 2 (two) times daily.   chlorhexidine  (HIBICLENS ) 4 % external liquid Apply topically daily as needed. (Patient not taking: Reported on 12/30/2024)   clindamycin  (CLINDAGEL) 1 % gel Apply topically 2 (two) times daily. (Patient not taking: Reported on 12/30/2024)   desogestrel-ethinyl estradiol (APRI ) 0.15-30 MG-MCG tablet Take 1 tablet by mouth daily. (Patient not  taking: Reported on 12/30/2024)    ALLERGIES  Allergies[1]  SOCIAL & SUBSTANCE USE HISTORY  Social History   Socioeconomic History   Marital status: Single    Spouse name: Not on file   Number of children: Not on file   Years of  education: Not on file   Highest education level: Not on file  Occupational History   Not on file  Tobacco Use   Smoking status: Some Days    Types: Cigars    Start date: 11/18/2021   Smokeless tobacco: Never  Vaping Use   Vaping status: Some Days   Substances: Nicotine, Flavoring, Nicotine-salt  Substance and Sexual Activity   Alcohol use: Not Currently    Alcohol/week: 6.0 standard drinks of alcohol    Types: 2 Glasses of wine, 4 Shots of liquor per week    Comment: Last drink last night (12/14/23) Drinks once monthly   Drug use: Yes    Frequency: 1.0 times per week    Types: Marijuana   Sexual activity: Yes    Partners: Male, Female    Birth control/protection: Condom, OCP    Comment: Condom use sometimes. Last sex today (12/15/23) w/o condom today.  Other Topics Concern   Not on file  Social History Narrative   ** Merged History Encounter **       Social Drivers of Health   Tobacco Use: High Risk (12/04/2024)   Patient History    Smoking Tobacco Use: Some Days    Smokeless Tobacco Use: Never    Passive Exposure: Not on file  Financial Resource Strain: High Risk (01/25/2023)   Received from [facility]   Overall Financial Resource Strain (CARDIA)    Difficulty of Paying Living Expenses: Very hard  Food Insecurity: Food Insecurity Present (11/20/2024)   Epic    Worried About Programme Researcher, Broadcasting/film/video in the Last Year: Sometimes true    Ran Out of Food in the Last Year: Never true  Transportation Needs: Unmet Transportation Needs (11/20/2024)   Epic    Lack of Transportation (Medical): Yes    Lack of Transportation (Non-Medical): No  Physical Activity: Sufficiently Active (04/12/2022)   Received from [facility]   Exercise Vital Sign    On average, how many days per week do you engage in moderate to strenuous exercise (like a brisk walk)?: 7 days    On average, how many minutes do you engage in exercise at this level?: 60 min  Stress:  Stress Concern Present (04/12/2022)   Received from [facility] - Occupational Stress Questionnaire    Feeling of Stress : Very much  Social Connections: Socially Isolated (04/12/2022)   Received from [facility]   Social Connection and Isolation Panel    In a typical week, how many times do you talk on the phone with family, friends, or neighbors?: Once a week    How often do you get together with friends or relatives?: Never    How often do you attend church or religious services?: Never    Do you belong to any clubs or organizations such as church groups, unions, fraternal or athletic groups, or school groups?: No    How often do you attend meetings of the clubs or organizations you belong to?: Never    Are you married, widowed, divorced, separated, never married, or living with a partner?: Living with partner  Depression (PHQ2-9): High Risk (12/04/2024)  Depression (PHQ2-9)    PHQ-2 Score: 21  Alcohol Screen: Low Risk (12/04/2024)   Alcohol Screen    Last Alcohol Screening Score (AUDIT): 0  Housing: High Risk (11/20/2024)   Epic    Unable to Pay for Housing in the Last Year: No    Number of Times Moved in the Last Year: 2    Homeless in the Last Year: Yes  Utilities: Not At Risk (11/20/2024)   Epic    Threatened with loss of utilities: No  Health Literacy: Not on file   Tobacco Use History[2] Social History   Substance and Sexual Activity  Alcohol Use Not Currently   Alcohol/week: 6.0 standard drinks of alcohol   Types: 2 Glasses of wine, 4 Shots of liquor per week   Comment: Last drink last night (12/14/23) Drinks once monthly   Social History   Substance and Sexual Activity  Drug Use Yes   Frequency: 1.0 times per week   Types: Marijuana    FAMILY HISTORY  Family History  Problem Relation Age of Onset   Hypertension Mother    Family Psychiatric History (if known):  cousin - history of  mental health hospitalization   MENTAL STATUS EXAM (MSE)  Mental Status Exam: General Appearance: Fairly Groomed  Orientation:  Full (Time, Place, and Person)  Memory:  Immediate;   Fair Recent;   Fair Remote;   Fair  Concentration:  Concentration: Fair and Attention Span: Fair  Recall:  Fair  Attention  Fair  Eye Contact:  Fair  Speech:  loud, garbled at intervals given tearfulness   Language:  Fair  Volume:  Increased  Mood: I don't know what to do!!!  Affect:  Labile, tearful  Thought Process:  Goal Directed  Thought Content:  Rumination  Suicidal Thoughts:  No  Homicidal Thoughts:  No  Judgement:  Poor  Insight:  Lacking  Psychomotor Activity:  Normal  Akathisia:  No  Fund of Knowledge:  Fair    Assets:  Communication Skills Desire for Improvement  Cognition:  WNL  ADL's:  Intact  AIMS (if indicated):       VITALS  Blood pressure (!) 146/92, pulse 78, temperature 98.1 F (36.7 C), temperature source Oral, resp. rate 18, last menstrual period 11/25/2024, SpO2 97%.  LABS  Admission on 12/30/2024  Component Date Value Ref Range Status   Preg Test, Ur 12/30/2024 NEGATIVE  NEGATIVE Final   Comment:        THE SENSITIVITY OF THIS METHODOLOGY IS >20 mIU/mL.    Salicylate Lvl 12/30/2024 <7.0 (L)  7.0 - 30.0 mg/dL Final   Performed at [facility], [address] [place], [place] [address]   Acetaminophen  (Tylenol ), Serum 12/30/2024 <10 (L)  10 - 30 ug/mL Final   Comment: (NOTE) Toxic concentrations can be more effectively related to post dose interval; >200, >100, and >50 ug/mL serum concentrations correspond to toxic concentrations at 4, 8, and 12 hours post dose, respectively.  Performed at [facility], [address] [place], [place] [address]    WBC 12/30/2024 12.2 (H)  4.0 - 10.5 K/uL Final   RBC 12/30/2024 5.44 (H)  3.87 - 5.11 MIL/uL Final   Hemoglobin 12/30/2024 15.1 (H)  12.0 - 15.0 g/dL Final   HCT 98/87/7973 47.3 (H)  36.0 -  46.0 % Final   MCV 12/30/2024 86.9  80.0 - 100.0 fL Final   MCH 12/30/2024 27.8  26.0 - 34.0 pg Final   MCHC 12/30/2024 31.9  30.0 -  36.0 g/dL Final   RDW 98/87/7973 13.7  11.5 - 15.5 % Final   Platelets 12/30/2024 402 (H)  150 - 400 K/uL Final   nRBC 12/30/2024 0.0  0.0 - 0.2 % Final   Neutrophils Relative % 12/30/2024 65  % Final   Neutro Abs 12/30/2024 7.9 (H)  1.7 - 7.7 K/uL Final   Lymphocytes Relative 12/30/2024 29  % Final   Lymphs Abs 12/30/2024 3.5  0.7 - 4.0 K/uL Final   Monocytes Relative 12/30/2024 5  % Final   Monocytes Absolute 12/30/2024 0.7  0.1 - 1.0 K/uL Final   Eosinophils Relative 12/30/2024 0  % Final   Eosinophils Absolute 12/30/2024 0.0  0.0 - 0.5 K/uL Final   Basophils Relative 12/30/2024 0  % Final   Basophils Absolute 12/30/2024 0.0  0.0 - 0.1 K/uL Final   Immature Granulocytes 12/30/2024 1  % Final   Abs Immature Granulocytes 12/30/2024 0.07  0.00 - 0.07 K/uL Final   Performed at [facility], [address] [place], [place] [address]   Sodium 12/30/2024 135  135 - 145 mmol/L Final   Potassium 12/30/2024 3.7  3.5 - 5.1 mmol/L Final   Chloride 12/30/2024 100  98 - 111 mmol/L Final   CO2 12/30/2024 19 (L)  22 - 32 mmol/L Final   Glucose, Bld 12/30/2024 103 (H)  70 - 99 mg/dL Final   Glucose reference range applies only to samples taken after fasting for at least 8 hours.   BUN 12/30/2024 10  6 - 20 mg/dL Final   Creatinine, Ser 12/30/2024 0.74  0.44 - 1.00 mg/dL Final   Calcium 98/87/7973 9.9  8.9 - 10.3 mg/dL Final   Total Protein 98/87/7973 8.8 (H)  6.5 - 8.1 g/dL Final   Albumin 98/87/7973 4.9  3.5 - 5.0 g/dL Final   AST 98/87/7973 31  15 - 41 U/L Final   ALT 12/30/2024 44  0 - 44 U/L Final   Alkaline Phosphatase 12/30/2024 100  38 - 126 U/L Final   Total Bilirubin 12/30/2024 0.4  0.0 - 1.2 mg/dL Final   GFR, Estimated 12/30/2024 >60  >60 mL/min Final   Comment: (NOTE) Calculated using the CKD-EPI Creatinine Equation (2021)    Anion gap  12/30/2024 16 (H)  5 - 15 Final   Performed at [facility], [address] [place], [place] [address]   Alcohol, Ethyl (B) 12/30/2024 <15  <15 mg/dL Final   Comment: (NOTE) For medical purposes only. Performed at [facility], [address] [place], [place] [address]    Color, Urine 12/30/2024 YELLOW (A)  YELLOW Final   APPearance 12/30/2024 HAZY (A)  CLEAR Final   Specific Gravity, Urine 12/30/2024 1.021  1.005 - 1.030 Final   pH 12/30/2024 6.0  5.0 - 8.0 Final   Glucose, UA 12/30/2024 NEGATIVE  NEGATIVE mg/dL Final   Hgb urine dipstick 12/30/2024 NEGATIVE  NEGATIVE Final   Bilirubin Urine 12/30/2024 NEGATIVE  NEGATIVE Final   Ketones, ur 12/30/2024 NEGATIVE  NEGATIVE mg/dL Final   Protein, ur 98/87/7973 30 (A)  NEGATIVE mg/dL Final   Nitrite 98/87/7973 NEGATIVE  NEGATIVE Final   Leukocytes,Ua 12/30/2024 NEGATIVE  NEGATIVE Final   RBC / HPF 12/30/2024 6-10  0 - 5 RBC/hpf Final   WBC, UA 12/30/2024 0-5  0 - 5 WBC/hpf Final   Bacteria, UA 12/30/2024 RARE (A)  NONE SEEN Final   Squamous Epithelial / HPF 12/30/2024 6-10  0 - 5 /HPF Final   Mucus  12/30/2024 PRESENT   Final   Performed at [facility], [address] [place], [place] [address]   Opiates 12/30/2024 NEGATIVE  NEGATIVE Final   Cocaine 12/30/2024 NEGATIVE  NEGATIVE Final   Benzodiazepines 12/30/2024 NEGATIVE  NEGATIVE Final   Amphetamines 12/30/2024 NEGATIVE  NEGATIVE Final   Tetrahydrocannabinol 12/30/2024 POSITIVE (A)  NEGATIVE Final   Barbiturates 12/30/2024 NEGATIVE  NEGATIVE Final   Methadone Scn, Ur 12/30/2024 NEGATIVE  NEGATIVE Final   Fentanyl  12/30/2024 NEGATIVE  NEGATIVE Final   Comment: (NOTE) Drug screen is for Medical Purposes only. Positive results are preliminary only. If confirmation is needed, notify lab within 5 days.  Drug Class                 Cutoff (ng/mL) Amphetamine and metabolites 1000 Barbiturate and metabolites 200 Benzodiazepine              200 Opiates  and metabolites     300 Cocaine and metabolites     300 THC                         50 Fentanyl                     5 Methadone                   300  Trazodone  is metabolized in vivo to several metabolites,  including pharmacologically active m-CPP, which is excreted in the  urine.  Immunoassay screens for amphetamines and MDMA have potential  cross-reactivity with these compounds and may provide false positive  result.  Performed at [facility], [address] [place], [place] [address]    Magnesium  12/30/2024 2.0  1.7 - 2.4 mg/dL Final   Performed at [facility], [address] [place], [place] [address]   Acetaminophen  (Tylenol ), Serum 12/30/2024 <10 (L)  10 - 30 ug/mL Final   Comment: (NOTE) Toxic concentrations can be more effectively related to post dose interval; >200, >100, and >50 ug/mL serum concentrations correspond to toxic concentrations at 4, 8, and 12 hours post dose, respectively.  Performed at [facility], [address] [place], [place] [address]     PSYCHIATRIC REVIEW OF SYSTEMS (ROS)  ROS: Notable for the following relevant positive findings: Review of Systems  Psychiatric/Behavioral:  Positive for depression and substance abuse. Negative for hallucinations, memory loss and suicidal ideas. The patient is nervous/anxious. The patient does not have insomnia.     Additional findings:      Musculoskeletal: No abnormal movements observed      Gait & Station: Laying/Sitting      Pain Screening: Present - mild to moderate      Nutrition & Dental Concerns: none disclosed   RISK FORMULATION/ASSESSMENT  Is the patient experiencing any suicidal or homicidal ideations: Yes       Explain if yes: intentional ingestion and multiple self-inflicted lacerations to self (one superficial and one deep) in addition to IVC stating patient was threatening suicide and attempted to leave a moving vehicle Protective factors considered for safety  management: Current care in a highly monitored health care setting  Risk factors/concerns considered for safety management:  Prior attempt Depression Substance abuse/dependence Recent loss Access to lethal means Hopelessness Impulsivity Isolation Unmarried  Is there a safety management plan with the patient and treatment team to minimize risk factors and promote protective factors: Yes  Explain: psychiatric hospitalization Is crisis care placement or psychiatric hospitalization recommended: Yes     Based on my current evaluation and risk assessment, patient is determined at this time to be at:  High risk  *RISK ASSESSMENT Risk assessment is a dynamic process; it is possible that this patient's condition, and risk level, may change. This should be re-evaluated and managed over time as appropriate. Please re-consult psychiatric consult services if additional assistance is needed in terms of risk assessment and management. If your team decides to discharge this patient, please advise the patient how to best access emergency psychiatric services, or to call 911, if their condition worsens or they feel unsafe in any way.   Charlene JONELLE Buba, MD Telepsychiatry Consult Services    [1]  Allergies Allergen Reactions   Dust Mite Extract    Grass Extracts [Gramineae Pollens]    Mold Extract [Trichophyton]    Other     Cat and dog dander   Pollen Extract   [2]  Social History Tobacco Use  Smoking Status Some Days   Types: Cigars   Start date: 11/18/2021  Smokeless Tobacco Never

## 2024-12-31 NOTE — ED Notes (Signed)
 IVC  GOING TO  [place]  DUNES  ON  01/01/25

## 2024-12-31 NOTE — BH Assessment (Signed)
 Referral information sent to:   [facility]  [id number]  [facility]  [phone]  [facility]  [phone]  [facility]  [phone]  [facility]  [phone]  [facility]  [phone]  [facility]  [phone]  [facility]  [phone]  [place] [facility]  [phone]  [facility]  [phone]  [facility]  [phone]  [facility]  [phone]  [facility]  [phone]  [facility]  [phone]  [facility]  [phone]

## 2024-12-31 NOTE — ED Notes (Signed)
 Pt given dinner tray and beverage

## 2024-12-31 NOTE — ED Notes (Signed)
 Pt asking about plan. RN tells pt she is being admitted. Pt immediatly comes agitated. Pt started punching the bed, and screaming. RN attempted to calm pt down and pt continues to scream. Pt request meds to help calm her down. Pt voluntarily takes mediation with no force.

## 2024-12-31 NOTE — ED Provider Notes (Signed)
 Psychiatric observation note  Vitals:   12/30/24 1645 12/30/24 2106  BP: (!) 148/97 (!) 143/101  Pulse: (!) 109 95  Resp: 18 18  Temp: 97.8 F (36.6 C) 97.6 F (36.4 C)  SpO2: 99% 98%    Currently the patient is sleeping, even unlabored respiration  Laceration is bandaged.  Discussed with nursing, they are performing regular nursing care and bandage changes  Reviewed labs over the last 24 hours.  Patient is currently voluntary.  Pending psychiatry consultation.  Psychiatry consult has not yet been performed   Dicky Anes, MD 12/31/24 [phone]

## 2025-01-01 DIAGNOSIS — Z046 Encounter for general psychiatric examination, requested by authority: Secondary | ICD-10-CM

## 2025-01-01 DIAGNOSIS — T50902A Poisoning by unspecified drugs, medicaments and biological substances, intentional self-harm, initial encounter: Secondary | ICD-10-CM | POA: Diagnosis not present

## 2025-01-01 DIAGNOSIS — R4588 Nonsuicidal self-harm: Secondary | ICD-10-CM

## 2025-01-01 DIAGNOSIS — Z638 Other specified problems related to primary support group: Secondary | ICD-10-CM

## 2025-01-01 NOTE — ED Notes (Signed)
 Meal provided

## 2025-01-01 NOTE — ED Provider Notes (Signed)
 Emergency Medicine Observation Re-evaluation Note   BP 112/86 (BP Location: Left Arm)   Pulse (!) 127   Temp 98.4 F (36.9 C) (Oral)   Resp 17   LMP 11/25/2024   SpO2 97%    ED Course / MDM   No reported events during my shift at the time of this note.   Pt is awaiting dispo from consultants   Ginnie Shams MD    Shams Ginnie, MD 01/01/25 0200

## 2025-01-01 NOTE — ED Provider Notes (Addendum)
 The patient has been placed under involuntary commitment.  She will be transferred via law enforcement.  She was seen and reevaluated by the psychiatry team today, and A Claudene is placed in IVC   Dicky Anes, MD 01/01/25 0914   ----------------------------------------- 9:21 AM on 01/01/2025 ----------------------------------------- Patient is under involuntary commitment.  She is awake alert stands up ambulates without difficulty.  She is not excited about going to [place], she was hoping to go to [facility] but ultimately excepting understanding she is now under involuntary commitment as well.  She appears stable and appropriate for transfer at this time    Dicky Anes, MD 01/01/25 [phone]

## 2025-01-01 NOTE — ED Notes (Signed)
 Patient sleeping

## 2025-01-01 NOTE — Consult Note (Signed)
 Patient was initially seen by [facility] telehealth providers who recommended involuntary commitment. In-person psychiatry was subsequently consulted by the primary emergency department provider regarding IVC. After review of the [facility] telehealth note, this provider confirmed that [facility] team did recommend involuntary commitment based on their assessment.  However, in-person evaluation by the on-site psychiatry team is necessary and required before IVC papers can be initiated. [place]  law and clinical best practice standards require that involuntary commitment be based on direct, in-person psychiatric evaluation. This provider cannot initiate IVC based solely on telehealth assessment without personally assessing the patient face-to-face. Direct in-person evaluation allows this provider to observe patient's behavior, appearance, and mental status firsthand, conduct comprehensive risk assessment through direct interaction, assess capacity and decision-making in real-time, and fulfill legal requirements for IVC certification that the provider has personally examined the patient.  On in-person examination with this provider, patient endorsed intentionally overdosing on medications due to family issues with her mother, confirming the concerning presentation noted by [facility]. Chart review and physical examination also reveal evidence of intentional self-harm including cutting behaviors. Patient demonstrates limited insight into the severity of her actions and minimized symptoms to this provider during the assessment, which raises additional concern about impaired judgment, poor reality testing, and ongoing risk for repeated self-harm.  Patient was recommended for involuntary commitment by [facility] telehealth providers based on their assessment findings. After completing independent in-person psychiatric evaluation, this provider agrees that involuntary commitment is clinically necessary and legally justified at this time  based on intentional suicide attempt via medication overdose, self-harm behaviors including cutting, ongoing family conflict serving as trigger for dangerous behaviors, limited insight and minimization of serious self-harm suggesting impaired judgment and high risk for repeated attempts, and need for inpatient psychiatric treatment in secure setting for safety and stabilization.  Patient has been accepted to an inpatient psychiatric facility with plan to proceed with transfer for continued inpatient psychiatric treatment. Patient was placed under involuntary commitment by this provider following in-person assessment and will be transferred to the accepting facility for ongoing care.

## 2025-01-01 NOTE — ED Notes (Signed)
 Pt transferred to [place] at this time. Pt transferred by [facility] at this time. Pt aware of admission and transfer. Pt calm and cooperative at this time. All belongings sent with [facility]. Report has been called.

## 2025-01-01 NOTE — ED Provider Notes (Signed)
 Presently, nursing team noting concerns that based on yesterday and that they have significant concern the patient would likely not be a good candidate for voluntary transfer for inpatient psychiatric care.  I do feel that she may need to be involuntary.  This would be to facilitate a safe transfer as she felt to potentially have a high risk for elopement as well.  I have engaged with our psychiatry team, Zelda Sharps, NP to reassess.  We will plan to have psychiatry formally assess if they wish to continue the patient on a voluntary basis or if this is indeed they would like to make her involuntary.   Dicky Anes, MD 01/01/25 [phone]

## 2025-01-01 NOTE — ED Notes (Signed)
 Pt made aware of transfer. Pt yelling at staff and states I don't want to. RN explains to pt she is under IVC. Pt begins screaming for staff to get out. Staff leave room. Pt remains screaming at this time.

## 2025-01-01 NOTE — ED Notes (Signed)
 [facility] called for transport to [facility]

## 2025-01-24 ENCOUNTER — Ambulatory Visit: Payer: Self-pay | Admitting: *Deleted

## 2025-01-24 NOTE — Telephone Encounter (Signed)
 No available appt today due to increasing redness and swelling at laceration site of thumb. Recommended UC or ED.  Please advise.          FYI Only or Action Required?: Action required by provider: request for appointment.  Patient was last seen in primary care on 12/04/2024 by Bernardo Fend, DO.  Called Nurse Triage reporting Numbness.  Symptoms began several days ago.  Interventions attempted: Other: stitches removed .  Symptoms are: gradually worsening.  Triage Disposition: See HCP Within 4 Hours (Or PCP Triage)  Patient/caregiver understands and will follow disposition?: Unsure                    Reason for Disposition  [1] Looks infected (spreading redness, pus) AND [2] large red area (> 2 in. or 5 cm)  Answer Assessment - Initial Assessment Questions Recommended UC due to redness , swelling and pain , N/T. No fever reported. No drainage reported.       1. ONSET: When did the pain start?     After stitches removed and did not report date, see ED encounter 12/30/24 2. LOCATION: Where is the pain located?     Right thumb  3. PAIN: How bad is the pain? (Scale 1-10; or mild, moderate, severe)   - MILD (1-3): doesn't interfere with normal activities   - MODERATE (4-7): interferes with normal activities (e.g., work or school) or awakens from sleep   - SEVERE (8-10): excruciating pain, unable to use hand at all     4/10 4. WORK OR EXERCISE: Has there been any recent work or exercise that involved this part (i.e., hand or wrist) of the body?     Using right hand and thumb for all activities 5. CAUSE: What do you think is causing the pain?     Laceration to right thumb , cut with knife 6. AGGRAVATING FACTORS: What makes the pain worse? (e.g., using computer)     Touching thumb 7. OTHER SYMPTOMS: Do you have any other symptoms? (e.g., neck pain, swelling, rash, numbness, fever)     Pain , numbness/ tingling  , swelling mild to right  thumb, redness reported around laceration site. Can bend thumb but painful.  Protocols used: Hand and Wrist Pain-A-AH
